# Patient Record
Sex: Female | Born: 1955 | Race: Black or African American | Hispanic: No | Smoking: Former smoker
Health system: Southern US, Community
[De-identification: ages and names within clinical notes are randomized; demographics above are authoritative.]

## PROBLEM LIST (undated history)

## (undated) DIAGNOSIS — I10 Essential (primary) hypertension: Secondary | ICD-10-CM

## (undated) HISTORY — PX: COLONOSCOPY: SHX174

---

## 2006-02-01 ENCOUNTER — Emergency Department: Payer: Self-pay | Admitting: Emergency Medicine

## 2006-08-28 ENCOUNTER — Ambulatory Visit: Payer: Self-pay | Admitting: Internal Medicine

## 2006-10-08 ENCOUNTER — Ambulatory Visit: Payer: Self-pay

## 2007-10-10 ENCOUNTER — Ambulatory Visit: Payer: Self-pay

## 2009-03-22 ENCOUNTER — Ambulatory Visit: Payer: Self-pay

## 2009-10-19 ENCOUNTER — Emergency Department: Payer: Self-pay | Admitting: Emergency Medicine

## 2009-11-10 ENCOUNTER — Emergency Department: Payer: Self-pay | Admitting: Emergency Medicine

## 2010-06-02 ENCOUNTER — Ambulatory Visit: Payer: Self-pay | Admitting: Family Medicine

## 2011-10-11 ENCOUNTER — Ambulatory Visit: Payer: Self-pay | Admitting: Family Medicine

## 2013-01-30 ENCOUNTER — Ambulatory Visit: Payer: Self-pay | Admitting: Family Medicine

## 2014-02-17 ENCOUNTER — Ambulatory Visit: Payer: Self-pay | Admitting: Family Medicine

## 2014-08-14 ENCOUNTER — Telehealth: Payer: Self-pay | Admitting: Surgery

## 2014-08-14 NOTE — Telephone Encounter (Signed)
Received referral from [facility] Pt needs office appt to have lipoma check on back. Phoned patient no answer and cant leave voicemail

## 2015-02-22 ENCOUNTER — Other Ambulatory Visit: Payer: Self-pay | Admitting: Family Medicine

## 2015-02-22 DIAGNOSIS — Z1231 Encounter for screening mammogram for malignant neoplasm of breast: Secondary | ICD-10-CM

## 2015-03-02 ENCOUNTER — Ambulatory Visit: Payer: BC Managed Care – PPO

## 2015-03-03 ENCOUNTER — Ambulatory Visit
Admission: RE | Admit: 2015-03-03 | Discharge: 2015-03-03 | Disposition: A | Discharge status: Home / Self Care | Payer: BC Managed Care – PPO | Source: Ambulatory Visit | Attending: Family Medicine | Admitting: Family Medicine

## 2015-03-03 DIAGNOSIS — Z1231 Encounter for screening mammogram for malignant neoplasm of breast: Secondary | ICD-10-CM

## 2016-03-09 ENCOUNTER — Other Ambulatory Visit: Payer: Self-pay | Admitting: Family Medicine

## 2016-03-09 DIAGNOSIS — Z1231 Encounter for screening mammogram for malignant neoplasm of breast: Secondary | ICD-10-CM

## 2016-03-15 ENCOUNTER — Other Ambulatory Visit: Payer: Self-pay | Admitting: Family Medicine

## 2016-03-15 ENCOUNTER — Ambulatory Visit
Admission: RE | Admit: 2016-03-15 | Discharge: 2016-03-15 | Disposition: A | Discharge status: Home / Self Care | Payer: BC Managed Care – PPO | Source: Ambulatory Visit | Attending: Family Medicine | Admitting: Family Medicine

## 2016-03-15 DIAGNOSIS — Z1231 Encounter for screening mammogram for malignant neoplasm of breast: Secondary | ICD-10-CM | POA: Diagnosis not present

## 2016-07-29 ENCOUNTER — Encounter: Payer: Self-pay | Admitting: Emergency Medicine

## 2016-07-29 ENCOUNTER — Emergency Department
Admission: EM | Admit: 2016-07-29 | Discharge: 2016-07-29 | Disposition: A | Discharge status: Home / Self Care | Payer: BC Managed Care – PPO | Attending: Emergency Medicine | Admitting: Emergency Medicine

## 2016-07-29 DIAGNOSIS — Y92410 Unspecified street and highway as the place of occurrence of the external cause: Secondary | ICD-10-CM | POA: Diagnosis not present

## 2016-07-29 DIAGNOSIS — I10 Essential (primary) hypertension: Secondary | ICD-10-CM | POA: Insufficient documentation

## 2016-07-29 DIAGNOSIS — S29012A Strain of muscle and tendon of back wall of thorax, initial encounter: Secondary | ICD-10-CM | POA: Diagnosis not present

## 2016-07-29 DIAGNOSIS — S3992XA Unspecified injury of lower back, initial encounter: Secondary | ICD-10-CM | POA: Diagnosis present

## 2016-07-29 DIAGNOSIS — Z87891 Personal history of nicotine dependence: Secondary | ICD-10-CM | POA: Insufficient documentation

## 2016-07-29 DIAGNOSIS — Y939 Activity, unspecified: Secondary | ICD-10-CM | POA: Insufficient documentation

## 2016-07-29 DIAGNOSIS — S39012A Strain of muscle, fascia and tendon of lower back, initial encounter: Secondary | ICD-10-CM

## 2016-07-29 DIAGNOSIS — Y999 Unspecified external cause status: Secondary | ICD-10-CM | POA: Diagnosis not present

## 2016-07-29 HISTORY — DX: Essential (primary) hypertension: I10

## 2016-07-29 MED ORDER — MELOXICAM 15 MG PO TABS: 15.0000 mg | ORAL_TABLET | Freq: Every day | ORAL | 0 refills | Status: DC

## 2016-07-29 MED ORDER — METHOCARBAMOL 500 MG PO TABS: 500.0000 mg | ORAL_TABLET | Freq: Four times a day (QID) | ORAL | 0 refills | Status: DC

## 2016-07-29 MED ORDER — ORPHENADRINE CITRATE 30 MG/ML IJ SOLN
60.0000 mg | Freq: Once | INTRAMUSCULAR | Status: AC
  Administered 2016-07-29: 60 mg via INTRAMUSCULAR
  Filled 2016-07-29: qty 2

## 2016-07-29 MED ORDER — KETOROLAC TROMETHAMINE 60 MG/2ML IM SOLN
60.0000 mg | Freq: Once | INTRAMUSCULAR | Status: AC
  Administered 2016-07-29: 60 mg via INTRAMUSCULAR
  Filled 2016-07-29: qty 2
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
## 2016-07-29 NOTE — ED Notes (Signed)
Pt verbalizes understanding of discharge instructions.
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
## 2016-08-07 ENCOUNTER — Ambulatory Visit
Admission: RE | Admit: 2016-08-07 | Discharge: 2016-08-07 | Disposition: A | Discharge status: Home / Self Care | Payer: BC Managed Care – PPO | Source: Ambulatory Visit | Attending: Chiropractor | Admitting: Chiropractor

## 2016-08-07 ENCOUNTER — Other Ambulatory Visit: Payer: Self-pay | Admitting: Chiropractor

## 2016-08-07 DIAGNOSIS — M546 Pain in thoracic spine: Secondary | ICD-10-CM

## 2016-08-07 DIAGNOSIS — M545 Low back pain: Secondary | ICD-10-CM

## 2016-08-07 DIAGNOSIS — M542 Cervicalgia: Secondary | ICD-10-CM

## 2016-08-07 DIAGNOSIS — M50323 Other cervical disc degeneration at C6-C7 level: Secondary | ICD-10-CM | POA: Diagnosis not present

## 2016-08-07 DIAGNOSIS — M549 Dorsalgia, unspecified: Secondary | ICD-10-CM | POA: Diagnosis present

## 2017-04-12 ENCOUNTER — Other Ambulatory Visit: Payer: Self-pay | Admitting: Family Medicine

## 2017-04-12 DIAGNOSIS — Z1231 Encounter for screening mammogram for malignant neoplasm of breast: Secondary | ICD-10-CM

## 2017-04-18 ENCOUNTER — Encounter (INDEPENDENT_AMBULATORY_CARE_PROVIDER_SITE_OTHER): Payer: Self-pay

## 2017-04-18 ENCOUNTER — Ambulatory Visit
Admission: RE | Admit: 2017-04-18 | Discharge: 2017-04-18 | Disposition: A | Discharge status: Home / Self Care | Payer: BC Managed Care – PPO | Source: Ambulatory Visit | Attending: Family Medicine | Admitting: Family Medicine

## 2017-04-18 DIAGNOSIS — Z1231 Encounter for screening mammogram for malignant neoplasm of breast: Secondary | ICD-10-CM | POA: Diagnosis present

## 2018-01-25 ENCOUNTER — Other Ambulatory Visit: Payer: Self-pay | Admitting: Family Medicine

## 2018-01-25 ENCOUNTER — Ambulatory Visit
Admission: RE | Admit: 2018-01-25 | Discharge: 2018-01-25 | Disposition: A | Discharge status: Home / Self Care | Payer: BC Managed Care – PPO | Attending: Family Medicine | Admitting: Family Medicine

## 2018-01-25 ENCOUNTER — Ambulatory Visit
Admission: RE | Admit: 2018-01-25 | Discharge: 2018-01-25 | Disposition: A | Discharge status: Home / Self Care | Payer: BC Managed Care – PPO | Source: Ambulatory Visit | Attending: Family Medicine | Admitting: Family Medicine

## 2018-01-25 DIAGNOSIS — G8929 Other chronic pain: Secondary | ICD-10-CM | POA: Insufficient documentation

## 2018-01-25 DIAGNOSIS — M79671 Pain in right foot: Secondary | ICD-10-CM | POA: Diagnosis present

## 2018-01-25 DIAGNOSIS — M25562 Pain in left knee: Secondary | ICD-10-CM

## 2018-07-18 ENCOUNTER — Other Ambulatory Visit: Payer: Self-pay | Admitting: Family Medicine

## 2018-07-18 DIAGNOSIS — Z1231 Encounter for screening mammogram for malignant neoplasm of breast: Secondary | ICD-10-CM

## 2018-07-30 ENCOUNTER — Other Ambulatory Visit: Payer: Self-pay

## 2018-07-30 ENCOUNTER — Encounter (INDEPENDENT_AMBULATORY_CARE_PROVIDER_SITE_OTHER): Payer: Self-pay

## 2018-07-30 ENCOUNTER — Ambulatory Visit
Admission: RE | Admit: 2018-07-30 | Discharge: 2018-07-30 | Disposition: A | Discharge status: Home / Self Care | Payer: BC Managed Care – PPO | Source: Ambulatory Visit | Attending: Family Medicine | Admitting: Family Medicine

## 2018-07-30 DIAGNOSIS — Z1231 Encounter for screening mammogram for malignant neoplasm of breast: Secondary | ICD-10-CM | POA: Insufficient documentation

## 2019-07-24 ENCOUNTER — Other Ambulatory Visit: Payer: Self-pay | Admitting: Family Medicine

## 2019-07-24 DIAGNOSIS — Z1231 Encounter for screening mammogram for malignant neoplasm of breast: Secondary | ICD-10-CM

## 2019-08-05 ENCOUNTER — Ambulatory Visit: Payer: BC Managed Care – PPO

## 2019-08-19 ENCOUNTER — Ambulatory Visit
Admission: RE | Admit: 2019-08-19 | Discharge: 2019-08-19 | Disposition: A | Discharge status: Home / Self Care | Payer: BC Managed Care – PPO | Source: Ambulatory Visit | Attending: Family Medicine | Admitting: Family Medicine

## 2019-08-19 ENCOUNTER — Other Ambulatory Visit: Payer: Self-pay

## 2019-08-19 DIAGNOSIS — Z1231 Encounter for screening mammogram for malignant neoplasm of breast: Secondary | ICD-10-CM | POA: Diagnosis present

## 2020-08-25 ENCOUNTER — Other Ambulatory Visit: Payer: Self-pay | Admitting: Family Medicine

## 2020-08-25 DIAGNOSIS — Z1231 Encounter for screening mammogram for malignant neoplasm of breast: Secondary | ICD-10-CM

## 2020-09-14 ENCOUNTER — Ambulatory Visit
Admission: RE | Admit: 2020-09-14 | Discharge: 2020-09-14 | Disposition: A | Discharge status: Home / Self Care | Payer: BC Managed Care – PPO | Source: Ambulatory Visit | Attending: Family Medicine | Admitting: Family Medicine

## 2020-09-14 ENCOUNTER — Other Ambulatory Visit: Payer: Self-pay

## 2020-09-14 DIAGNOSIS — Z1231 Encounter for screening mammogram for malignant neoplasm of breast: Secondary | ICD-10-CM | POA: Diagnosis not present

## 2021-04-08 ENCOUNTER — Other Ambulatory Visit: Payer: Self-pay | Admitting: Family Medicine

## 2021-04-08 DIAGNOSIS — I10 Essential (primary) hypertension: Secondary | ICD-10-CM

## 2021-04-08 DIAGNOSIS — N261 Atrophy of kidney (terminal): Secondary | ICD-10-CM

## 2021-04-19 ENCOUNTER — Ambulatory Visit
Admission: RE | Admit: 2021-04-19 | Discharge: 2021-04-19 | Disposition: A | Discharge status: Home / Self Care | Payer: BC Managed Care – PPO | Source: Ambulatory Visit | Attending: Family Medicine | Admitting: Family Medicine

## 2021-04-19 ENCOUNTER — Other Ambulatory Visit: Payer: Self-pay

## 2021-04-19 DIAGNOSIS — N261 Atrophy of kidney (terminal): Secondary | ICD-10-CM | POA: Insufficient documentation

## 2021-04-19 DIAGNOSIS — I10 Essential (primary) hypertension: Secondary | ICD-10-CM | POA: Diagnosis not present

## 2021-08-30 ENCOUNTER — Other Ambulatory Visit: Payer: Self-pay | Admitting: Family Medicine

## 2021-08-30 DIAGNOSIS — Z1231 Encounter for screening mammogram for malignant neoplasm of breast: Secondary | ICD-10-CM

## 2021-09-15 ENCOUNTER — Ambulatory Visit
Admission: RE | Admit: 2021-09-15 | Discharge: 2021-09-15 | Disposition: A | Discharge status: Home / Self Care | Payer: BC Managed Care – PPO | Source: Ambulatory Visit | Attending: Family Medicine | Admitting: Family Medicine

## 2021-09-15 DIAGNOSIS — Z1231 Encounter for screening mammogram for malignant neoplasm of breast: Secondary | ICD-10-CM | POA: Diagnosis present

## 2021-09-20 ENCOUNTER — Other Ambulatory Visit: Payer: Self-pay | Admitting: Family Medicine

## 2021-09-20 DIAGNOSIS — R921 Mammographic calcification found on diagnostic imaging of breast: Secondary | ICD-10-CM

## 2021-09-20 DIAGNOSIS — R928 Other abnormal and inconclusive findings on diagnostic imaging of breast: Secondary | ICD-10-CM

## 2021-10-05 ENCOUNTER — Ambulatory Visit
Admission: RE | Admit: 2021-10-05 | Discharge: 2021-10-05 | Disposition: A | Discharge status: Home / Self Care | Payer: BC Managed Care – PPO | Source: Ambulatory Visit | Attending: Family Medicine | Admitting: Family Medicine

## 2021-10-05 DIAGNOSIS — R928 Other abnormal and inconclusive findings on diagnostic imaging of breast: Secondary | ICD-10-CM | POA: Diagnosis present

## 2021-10-05 DIAGNOSIS — R921 Mammographic calcification found on diagnostic imaging of breast: Secondary | ICD-10-CM | POA: Insufficient documentation

## 2021-10-07 ENCOUNTER — Other Ambulatory Visit: Payer: Self-pay | Admitting: Family Medicine

## 2021-10-07 DIAGNOSIS — R921 Mammographic calcification found on diagnostic imaging of breast: Secondary | ICD-10-CM

## 2021-10-07 DIAGNOSIS — R928 Other abnormal and inconclusive findings on diagnostic imaging of breast: Secondary | ICD-10-CM

## 2021-10-12 IMAGING — MG MM DIGITAL SCREENING BILAT W/ TOMO AND CAD
8 series · 8 of 24 positions shown · non-contrast
Comparison: Previous exam(s).

CLINICAL DATA: Screening.

EXAM:
DIGITAL SCREENING BILATERAL MAMMOGRAM WITH TOMOSYNTHESIS AND CAD
TECHNIQUE: Bilateral screening digital craniocaudal and mediolateral oblique
mammograms were obtained. Bilateral screening digital breast
tomosynthesis was performed. The images were evaluated with
computer-aided detection.

[R MLO synth-2D]
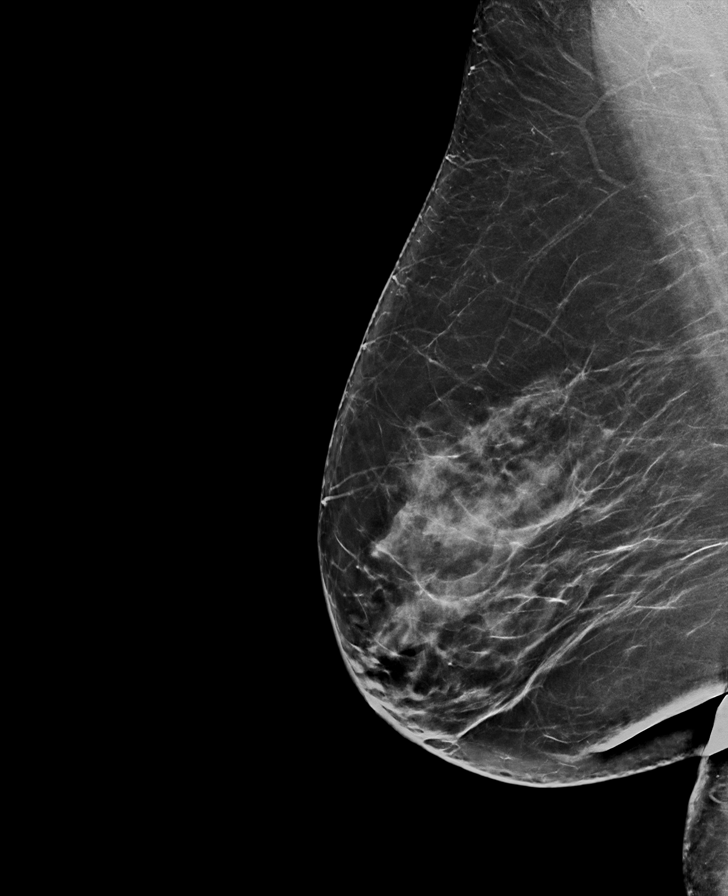

[R CC synth-2D]
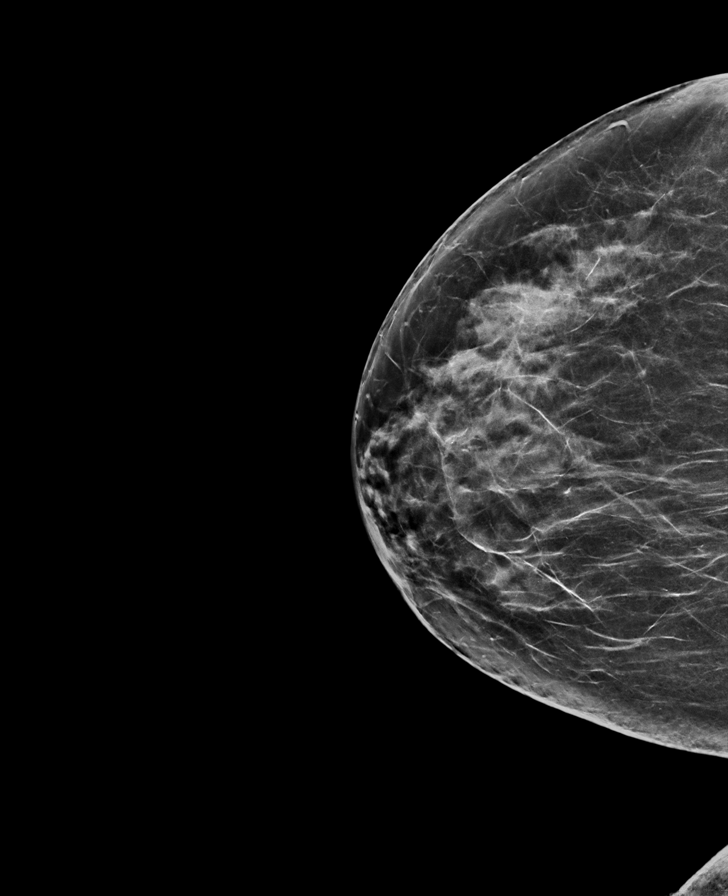

[L CC synth-2D]
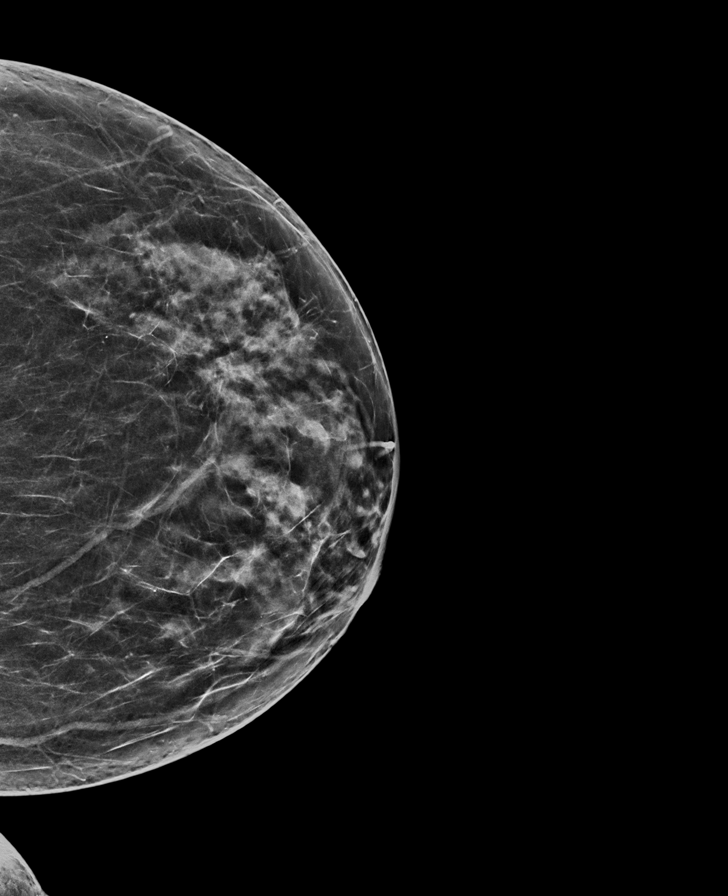

[L MLO synth-2D]
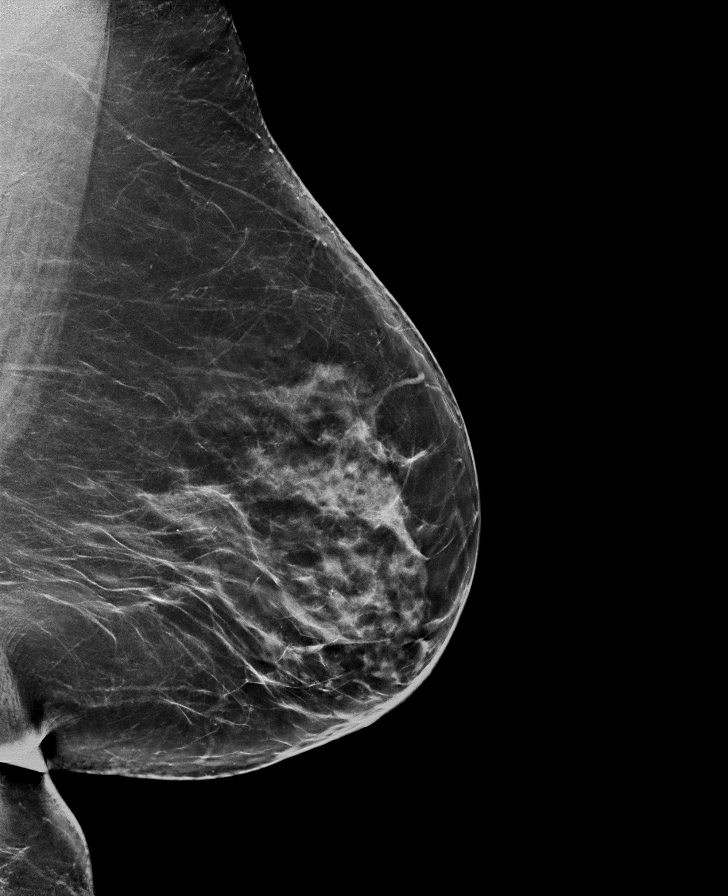

[L MLO tomo · tomo slice 43/86.0]
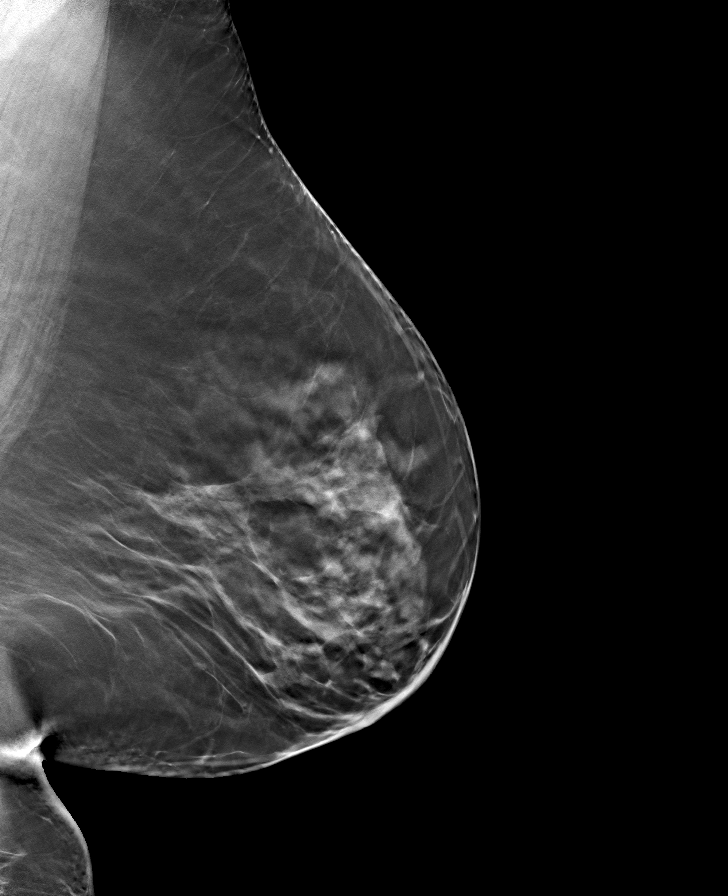

[R MLO tomo · tomo slice 43/85.0]
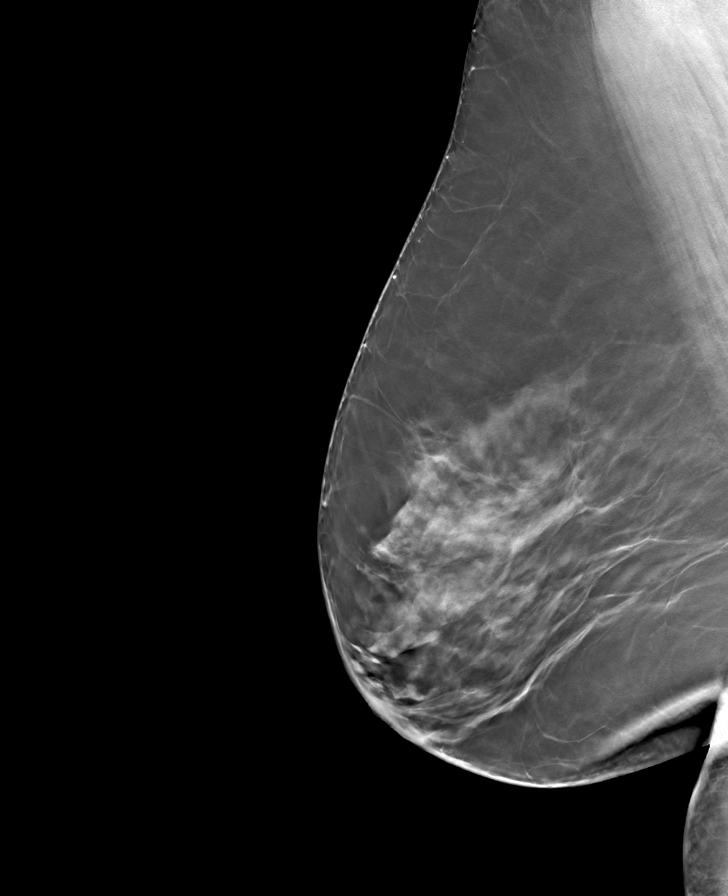

[L CC tomo · tomo slice 35/70.0]
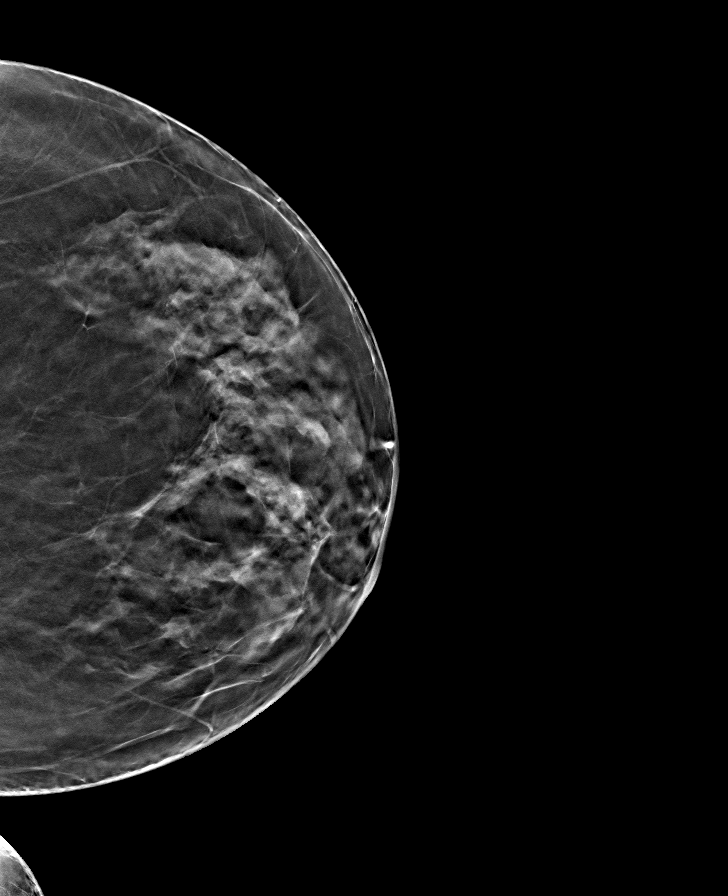

[R CC tomo · tomo slice 38/75.0]
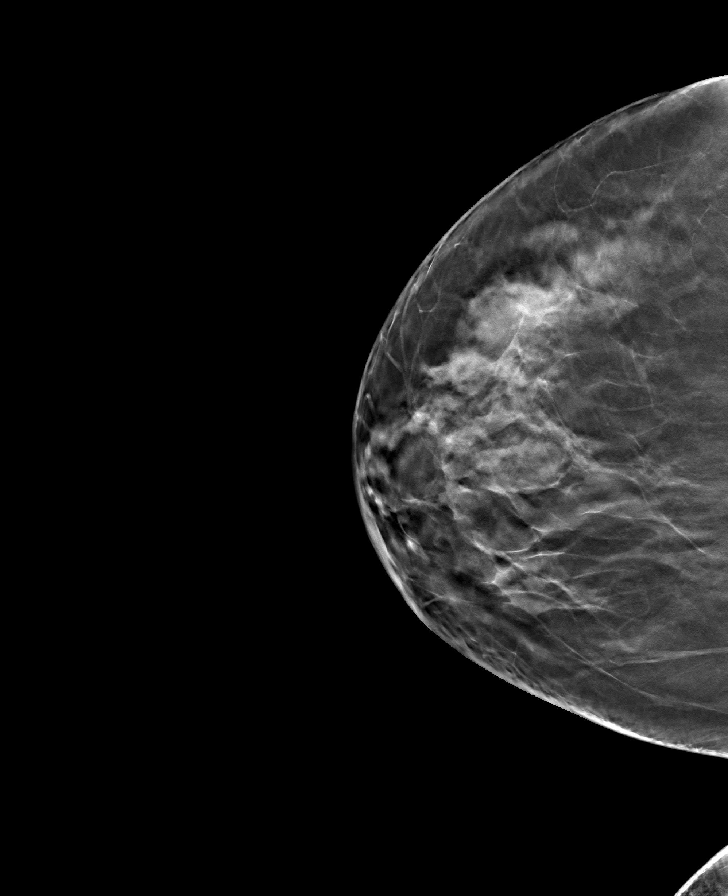

[8 of 24 positions shown; findings below may reference images not displayed]

ACR Breast Density Category c: The breast tissue is heterogeneously
dense, which may obscure small masses.
FINDINGS: There are no findings suspicious for malignancy.
IMPRESSION: No mammographic evidence of malignancy. A result letter of this
screening mammogram will be mailed directly to the patient.

RECOMMENDATION:
Screening mammogram in one year. (Code:[id number])

BI-RADS CATEGORY  1: Negative.

## 2021-10-19 ENCOUNTER — Ambulatory Visit
Admission: RE | Admit: 2021-10-19 | Discharge: 2021-10-19 | Disposition: A | Discharge status: Home / Self Care | Payer: BC Managed Care – PPO | Source: Ambulatory Visit | Attending: Family Medicine | Admitting: Family Medicine

## 2021-10-19 DIAGNOSIS — R928 Other abnormal and inconclusive findings on diagnostic imaging of breast: Secondary | ICD-10-CM

## 2021-10-19 DIAGNOSIS — R921 Mammographic calcification found on diagnostic imaging of breast: Secondary | ICD-10-CM

## 2021-10-19 HISTORY — PX: BREAST BIOPSY: SHX20

## 2021-10-21 ENCOUNTER — Encounter: Payer: Self-pay | Admitting: *Deleted

## 2021-10-21 DIAGNOSIS — D0512 Intraductal carcinoma in situ of left breast: Secondary | ICD-10-CM

## 2021-10-21 NOTE — Progress Notes (Signed)
Received referral for newly diagnosed breast cancer from [facility].  Navigation initiated.  She will see Dr. Grayland Ormond on 10/3 at 3:00.   She would like to see Dr. Peyton Najjar at [facility], I will call Monday when they open to get that referral.

## 2021-10-24 ENCOUNTER — Encounter: Payer: Self-pay | Admitting: *Deleted

## 2021-10-24 NOTE — Progress Notes (Signed)
Chloe Jones will see Dr. Peyton Najjar on 10/3 at 2:15 and Dr. Grayland Ormond on  10/5 at 3:00.   Appt. Details give to patient.

## 2021-10-25 ENCOUNTER — Other Ambulatory Visit: Payer: BC Managed Care – PPO

## 2021-10-25 ENCOUNTER — Encounter: Payer: Self-pay | Admitting: Oncology

## 2021-10-25 ENCOUNTER — Ambulatory Visit: Payer: BC Managed Care – PPO | Admitting: Oncology

## 2021-10-25 DIAGNOSIS — D0512 Intraductal carcinoma in situ of left breast: Secondary | ICD-10-CM

## 2021-10-25 HISTORY — DX: Intraductal carcinoma in situ of left breast: D05.12

## 2021-10-25 NOTE — Progress Notes (Signed)
[place]  [phone]) [phone] Fax:([phone]) [phone]  ID: Lenard Galloway OB: 17-Sep-1955  MR#: [id number]  HUD#:[id number]  Patient Care Team: [facility] [place] as PCP - Freda Munro, Nelwyn Salisbury, RN as Oncology Nurse Navigator  CHIEF COMPLAINT: DCIS left breast  INTERVAL HISTORY: Patient is a 66 year old female who was noted to have an abnormality on her routine yearly screening mammogram.  Subsequent ultrasound and biopsy revealed noninvasive DCIS.  She currently feels well and is asymptomatic.  She has no neurologic complaints.  She denies any recent fevers or illnesses.  She has a good appetite and denies weight loss.  She has no chest pain, shortness of breath, cough, or chills.  She denies any nausea, vomiting, constipation, or diarrhea.  She has no urinary complaints.  Patient feels at her baseline offers no specific complaints today.  REVIEW OF SYSTEMS:   Review of Systems  Constitutional: Negative.  Negative for fever, malaise/fatigue and weight loss.  Respiratory: Negative.  Negative for cough, hemoptysis and shortness of breath.   Cardiovascular: Negative.  Negative for chest pain and leg swelling.  Gastrointestinal: Negative.  Negative for abdominal pain.  Genitourinary: Negative.  Negative for dysuria.  Musculoskeletal: Negative.  Negative for back pain.  Skin: Negative.  Negative for rash.  Neurological: Negative.  Negative for dizziness, focal weakness, weakness and headaches.  Psychiatric/Behavioral: Negative.  The patient is not nervous/anxious.     As per HPI. Otherwise, a complete review of systems is negative.  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   Hypertension     PAST SURGICAL HISTORY: Past Surgical History:  Procedure Laterality Date   BREAST BIOPSY Left 10/19/2021   Left Breast stereo bx, X clip path pending    FAMILY HISTORY: Family  History  Problem Relation Age of Onset   Breast cancer Neg Hx     ADVANCED DIRECTIVES (Y/N):  N  HEALTH MAINTENANCE: Social History   Tobacco Use   Smoking status: Former   Smokeless tobacco: Never  Substance Use Topics   Alcohol use: Yes   Drug use: Not Currently     Colonoscopy:  PAP:  Bone density:  Lipid panel:  No Known Allergies  Current Outpatient Medications  Medication Sig Dispense Refill   meloxicam (MOBIC) 15 MG tablet Take 1 tablet (15 mg total) by mouth daily. 30 tablet 0   methocarbamol (ROBAXIN) 500 MG tablet Take 1 tablet (500 mg total) by mouth 4 (four) times daily. 16 tablet 0   No current facility-administered medications for this visit.    OBJECTIVE: Vitals:   10/27/21 1453  BP: 112/73  Pulse: 82  Temp: 97.8 F (36.6 C)     Body mass index is 40.68 kg/m.    ECOG FS:0 - Asymptomatic  General: Well-developed, well-nourished, no acute distress. Eyes: Pink conjunctiva, anicteric sclera. HEENT: Normocephalic, moist mucous membranes. Breast: Exam recently performed by another provider. Lungs: No audible wheezing or coughing. Heart: Regular rate and rhythm. Abdomen: Soft, nontender, no obvious distention. Musculoskeletal: No edema, cyanosis, or clubbing. Neuro: Alert, answering all questions appropriately. Cranial nerves grossly intact. Skin: No rashes or petechiae noted. Psych: Normal affect. Lymphatics: No cervical, calvicular, axillary or inguinal LAD.   LAB RESULTS:  No results found for: "NA", "K", "CL", "CO2", "GLUCOSE", "BUN", "CREATININE", "CALCIUM", "PROT", "ALBUMIN", "AST", "ALT", "ALKPHOS", "BILITOT", "GFRNONAA", "GFRAA"  No results found for: "WBC", "NEUTROABS", "HGB", "HCT", "MCV", "PLT"   STUDIES:  MM LT BREAST BX W LOC DEV 1ST LESION IMAGE BX SPEC STEREO GUIDE  Addendum Date: 10/21/2021   ADDENDUM REPORT: 10/21/2021 09:08 ADDENDUM: Pathology revealed DUCTAL CARCINOMA IN SITU (DCIS), HIGH-GRADE, COMEDO TYPE WITH ASSOCIATED  CALCIFICATIONS of the LEFT breast, inner aspect (x clip). This was found to be concordant by Dr. Valentino Saxon. Pathology results were discussed with the patient by telephone. The patient reported doing well after the biopsy with tenderness at the site. Post biopsy instructions were reviewed and questions were answered. The patient was encouraged to call [facility] for any additional concerns. Reports and recommendations sent to Roberts, Oncology Nurse Navigator at [facility] for surgical / oncology referral. Consider Breast MRI with and without contrast given high grade histology. Pathology results reported by Stacie Acres RN on 10/20/2021. Electronically Signed   By: Valentino Saxon M.D.   On: 10/21/2021 09:08   Result Date: 10/21/2021 CLINICAL DATA:  Indeterminate calcifications EXAM: LEFT BREAST STEREOTACTIC CORE NEEDLE BIOPSY COMPARISON:  Previous exam(s). FINDINGS: The patient and I discussed the procedure of stereotactic-guided biopsy including benefits and alternatives. We discussed the high likelihood of a successful procedure. We discussed the risks of the procedure including infection, bleeding, tissue injury, clip migration, and inadequate sampling. Informed written consent was given. The usual time out protocol was performed immediately prior to the procedure. Using sterile technique and 1% lidocaine and 1% lidocaine with epinephrine as local anesthetic, under stereotactic guidance, a 9 gauge vacuum assisted device was used to perform core needle biopsy of calcifications in the inner aspect of the left breast using a superior approach. Specimen radiograph was performed showing representative calcifications in 5 of 6 samples. Specimens with calcifications are identified for pathology. Lesion quadrant: Upper inner quadrant At the conclusion of the procedure, an X shaped tissue marker clip was deployed into the biopsy cavity. Follow-up  2-view mammogram was performed and dictated separately. IMPRESSION: Stereotactic-guided biopsy of indeterminate calcifications. No apparent complications. Electronically Signed: By: Valentino Saxon M.D. On: 10/19/2021 10:09   MM CLIP PLACEMENT LEFT  Result Date: 10/19/2021 CLINICAL DATA:  Indeterminate calcifications. Status post stereotactic guided biopsy EXAM: 3D DIAGNOSTIC LEFT MAMMOGRAM POST STEREOTACTIC BIOPSY COMPARISON:  Previous exam(s). FINDINGS: 3D Mammographic images were obtained following stereotactic guided biopsy of calcifications. The X shaped biopsy marking clip is in expected position at the site of biopsy. IMPRESSION: Appropriate positioning of the X shaped biopsy marking clip at the site of biopsy in the inner breast. Final Assessment: Post Procedure Mammograms for Marker Placement Electronically Signed   By: Valentino Saxon M.D.   On: 10/19/2021 10:10  MM DIAG BREAST TOMO UNI LEFT  Result Date: 10/05/2021 CLINICAL DATA:  Callback for LEFT breast calcifications. EXAM: DIGITAL DIAGNOSTIC UNILATERAL LEFT MAMMOGRAM WITH TOMOSYNTHESIS TECHNIQUE: Left digital diagnostic mammography and breast tomosynthesis was performed. COMPARISON:  Previous exam(s). ACR Breast Density Category c: The breast tissue is heterogeneously dense, which may obscure small masses. FINDINGS: Spot magnification views of the LEFT breast demonstrate a 3 mm group of pleomorphic calcifications in the LEFT inner breast at middle depth. These are not definitively stable in comparison to remote prior mammograms. No additional suspicious findings are noted. IMPRESSION: There is a 3 mm group of indeterminate calcifications in the LEFT inner breast at middle depth. Recommend stereotactic guided biopsy for definitive characterization. RECOMMENDATION: LEFT breast stereotactic guided biopsy x1 I have discussed the findings and recommendations with the patient. The biopsy procedure was discussed with the  patient and questions  were answered. Patient expressed their understanding of the biopsy recommendation. Patient will be scheduled for biopsy at her earliest convenience by the schedulers. Ordering provider will be notified. If applicable, a reminder letter will be sent to the patient regarding the next appointment. BI-RADS CATEGORY  4: Suspicious. Electronically Signed   By: Valentino Saxon M.D.   On: 10/05/2021 15:49   ASSESSMENT: DCIS left breast.  PLAN:    DCIS left breast: Pathology and imaging reviewed independently.  Patient has had consultation with surgery plans of lumpectomy scheduled for November 02, 2021.  Given the noninvasive nature of the lesion, she does not require adjuvant chemotherapy.  Given she is having a lumpectomy, she will benefit from adjuvant XRT and referral has been placed to radiation oncology.  She will also likely benefit from tamoxifen for 5 years at the completion of her treatments.  Return to clinic on November 16, 2021 2 weeks after her surgery to discuss her final pathology results, additional treatment planning if necessary, and consultation with radiation oncology.  I spent a total of 60 minutes reviewing chart data, face-to-face evaluation with the patient, counseling and coordination of care as detailed above.   Patient expressed understanding and was in agreement with this plan. She also understands that She can call clinic at any time with any questions, concerns, or complaints.    Cancer Staging  Ductal carcinoma in situ (DCIS) of left breast Staging form: Breast, AJCC 8th Edition - Clinical stage from 10/28/2021: Stage 0 (cTis (DCIS), cN0, cM0, ER+, PR: Not Assessed, HER2: Not Assessed) - Signed by Lloyd Huger, MD on 10/28/2021 Stage prefix: Initial diagnosis   Lloyd Huger, MD   10/28/2021 6:39 AM

## 2021-10-26 ENCOUNTER — Other Ambulatory Visit: Payer: Self-pay | Admitting: General Surgery

## 2021-10-26 DIAGNOSIS — D0512 Intraductal carcinoma in situ of left breast: Secondary | ICD-10-CM

## 2021-10-27 ENCOUNTER — Inpatient Hospital Stay: Payer: BC Managed Care – PPO | Attending: Oncology | Admitting: Oncology

## 2021-10-27 ENCOUNTER — Ambulatory Visit: Payer: Self-pay | Admitting: General Surgery

## 2021-10-27 ENCOUNTER — Encounter: Payer: Self-pay | Admitting: Oncology

## 2021-10-27 ENCOUNTER — Encounter: Payer: Self-pay | Admitting: *Deleted

## 2021-10-27 ENCOUNTER — Inpatient Hospital Stay: Payer: BC Managed Care – PPO

## 2021-10-27 DIAGNOSIS — I10 Essential (primary) hypertension: Secondary | ICD-10-CM | POA: Insufficient documentation

## 2021-10-27 DIAGNOSIS — D0512 Intraductal carcinoma in situ of left breast: Secondary | ICD-10-CM

## 2021-10-27 DIAGNOSIS — Z87891 Personal history of nicotine dependence: Secondary | ICD-10-CM | POA: Insufficient documentation

## 2021-10-27 NOTE — H&P (View-Only) (Signed)
PATIENT PROFILE: Chloe Jones is a 66 y.o. female who presents to the Clinic for consultation at the request of Dr. 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  PCP:  Chloe Median, MD  HISTORY OF PRESENT ILLNESS: [name] reports she had her usual screening mammogram on 09/16/2018.  She was found with calcification of the left breast.  There was a 3 mm calcifications.  She had diagnostic mammogram that confirmed that the suspicious calcifications are concerning for malignancy.  This led to stereotactic core needle biopsy.  He was found with DCIS high-grade with comedonecrosis.  I personally evaluated the images of the screening, diagnostic mammograms and the core needle biopsy.  Patient denies any breast pain, any nipple discharge, any palpable masses or any skin changes.  PROBLEM LIST: Problem List  Date Reviewed: 09/12/2021          Noted   Atrophy of right kidney 01/21/2020   Prediabetes 07/18/2018   Neoplasm of uncertain behavior 6/76/7209   LVH (left ventricular hypertrophy) 02/18/2013   Hypertriglyceridemia 01/24/2013   Microalbuminuria 01/24/2013   HTN (hypertension) 01/20/2013   Insomnia 11/03/2011   Body mass index (BMI) of 40.0-44.9 in adult (CMS-HCC) 11/03/2011   Menopausal state 11/03/2011   Carpal tunnel syndrome, bilateral Unknown   Hx of adenomatous polyp of colon 08/26/2009   Overview    Colonoscopy 2011 Dr Chloe Jones 60m adenoma        GENERAL REVIEW OF SYSTEMS:   General ROS: negative for - chills, fatigue, fever, weight gain or weight loss Allergy and Immunology ROS: negative for - hives  Hematological and Lymphatic ROS: negative for - bleeding problems or bruising, negative for palpable nodes Endocrine ROS: negative for - heat or cold intolerance, hair changes Respiratory ROS: negative for - cough, shortness of breath or wheezing Cardiovascular ROS: no chest pain or palpitations GI ROS: negative for nausea, vomiting, abdominal pain, diarrhea,  constipation Musculoskeletal ROS: negative for - joint swelling or muscle pain Neurological ROS: negative for - confusion, syncope Dermatological ROS: negative for pruritus and rash Psychiatric: negative for anxiety, depression, difficulty sleeping and memory loss  MEDICATIONS: Current Outpatient Medications  Medication Sig Dispense Refill   amLODIPine (NORVASC) 10 MG tablet TAKE 1 TABLET BY MOUTH EVERY DAY 90 tablet 3   losartan (COZAAR) 25 MG tablet TAKE 1 TABLET BY MOUTH EVERY DAY 90 tablet 3   magnesium 200 mg Take 200 mg by mouth once daily. (Patient not taking: Reported on 09/12/2021)     No current facility-administered medications for this visit.    ALLERGIES: Patient has no known allergies.  PAST MEDICAL HISTORY: Past Medical History:  Diagnosis Date   Atrophy of right kidney 01/21/2020   Body mass index (BMI) of 40.0-44.9 in adult (CMS-HCC) 11/03/2011   Carpal tunnel syndrome, bilateral    HTN (hypertension) 01/20/2013   Hypertriglyceridemia 01/24/2013   Insomnia 11/03/2011   LVH (left ventricular hypertrophy) 02/18/2013   Menopausal state 11/03/2011   Microalbuminuria 01/24/2013   Neoplasm of uncertain behavior 34/70/9628   PAST SURGICAL HISTORY: Past Surgical History:  Procedure Laterality Date   COLONOSCOPY  2011   Dr. TTheophilus Kinds65mpolyp, tubular adenoma   COLONOSCOPY W/REMOVAL LESIONS BY SNARE N/A 08/28/2019   Procedure: COLONOSCOPY, FLEXIBLE; WITH REMOVAL OF TUMOR(S), POLYP(S), OR OTHER LESION(S) BY SNARE TECHNIQUE;  Surgeon: Chloe Jones;  Location: [facility] Service: Gastroenterology;  Laterality: N/A;   COLONOSCOPY N/A 07/20/2021   Procedure: Colonoscopy: h/o 1552molyp;  Surgeon: Chloe Jones  Chloe Romp, MD;  Location: [facility];  Service: Gastroenterology;  Laterality: N/A;     FAMILY HISTORY: Family History  Problem Relation Age of Onset   High blood pressure (Hypertension) Father    Diabetes Father      SOCIAL HISTORY: Social  History   Socioeconomic History   Marital status: Widowed  Occupational History   Occupation: Fish farm manager  Tobacco Use   Smoking status: Former    Years: 3.00    Types: Cigarettes   Smokeless tobacco: Never  Vaping Use   Vaping Use: Never used  Substance and Sexual Activity   Alcohol use: Yes    Alcohol/week: 0.0 standard drinks    Comment: occasionally   Drug use: No   Sexual activity: Never    Partners: Male    Birth control/protection: Surgical    Comment: Tubal sterilization.  Social History Narrative   Widowed, lives alone. Two grown daughters. Two grandchildren that live in [place]. Some college. Works part time in Sales executive. Walks once to twice a week for 45 minutes.     PHYSICAL EXAM: Vitals:   10/25/21 1416  BP: 119/78  Pulse: 86   Body mass index is 41.13 kg/m. Weight: (!) 108.7 kg (239 lb 10.2 oz) (PER CHART)   GENERAL: Alert, active, oriented x3  HEENT: Pupils equal reactive to light. Extraocular movements are intact. Sclera clear. Palpebral conjunctiva normal red color.Pharynx clear.  NECK: Supple with no palpable mass and no adenopathy.  LUNGS: Sound clear with no rales rhonchi or wheezes.  HEART: Regular rhythm S1 and S2 without murmur.  BREAST: breasts appear normal, no suspicious masses, no skin or nipple changes or axillary nodes.  ABDOMEN: Soft and depressible, nontender with no palpable mass, no hepatomegaly.  EXTREMITIES: Well-developed well-nourished symmetrical with no dependent edema.  NEUROLOGICAL: Awake alert oriented, facial expression symmetrical, moving all extremities.  REVIEW OF DATA: I have reviewed the following data today: Office Visit on 09/12/2021  Component Date Value   Diagnostic Interpretation 09/12/2021 Comment    Specimen adequacy: - Lab* 09/12/2021 Comment    Clinician provided ICD10* 09/12/2021 Comment    PERFORMED BY: - [facility] 09/12/2021 Comment    . - [facility] 09/12/2021 .    Note: - [facility]  09/12/2021 Comment    Test Method MT21 - [facility] 09/12/2021 Comment    HPV APTIMA - [facility] 09/12/2021 Negative    HPV Genotype Reflex - [facility] 09/12/2021 Comment   Office Visit on 08/26/2021  Component Date Value   Cholesterol, Total 08/26/2021 150    LDL Calculated 08/26/2021 64    HDL 08/26/2021 71    Triglyceride 08/26/2021 75    Sodium 08/26/2021 138    Potassium 08/26/2021 3.8    Chloride 08/26/2021 108    Carbon Dioxide (CO2) 08/26/2021 22    Urea Nitrogen (BUN) 08/26/2021 17    Creatinine 08/26/2021 0.9    Glucose 08/26/2021 92    Calcium 08/26/2021 9.6    AST (Aspartate Aminotran* 08/26/2021 17    ALT (Alanine Aminotransf* 08/26/2021 17    Bilirubin, Total 08/26/2021 0.4    Alk Phos (Alkaline Phosp* 08/26/2021 84    Albumin 08/26/2021 4.0    Protein, Total 08/26/2021 7.8    Anion Gap 08/26/2021 8    BUN/CREA Ratio 08/26/2021 19    Glomerular Filtration Ra* 08/26/2021 71      ASSESSMENT: Ms. Delamora is a 66 y.o. female presenting for consultation for DCIS of the left breast.    Patient was oriented  again about the pathology results. Surgical alternatives were discussed with patient including partial vs total mastectomy. Surgical technique and post operative care was discussed with patient. Risk of surgery was discussed with patient including but not limited to: wound infection, seroma, hematoma, brachial plexopathy, mondor's disease (thrombosis of small veins of breast), chronic wound pain, breast lymphedema, altered sensation to the nipple and cosmesis among others.   Ductal carcinoma in situ (DCIS) of left breast [D05.12]  PLAN: 1.  Radiofrequency tag partial mastectomy of the left breast (19301) 2.  CBC, CMP 3.  Avoid taking aspirin 5 days before the procedure 4.  Contact us if you have any concern  Patient verbalized understanding, all questions were answered, and were agreeable with the plan outlined above.     Herbert Pun, MD  Electronically signed by  Herbert Pun, MD

## 2021-10-27 NOTE — Research (Addendum)
Trial:  MTG-015 - Tissue and Bodily Fluids: Translational Medicine: Discovery and Evaluation of Biomarkers/Pharmacogenomics for the Diagnosis and Personalized Management of Patients   Patient Chloe Jones was identified by this nurse as a potential candidate for the above listed study.  This Clinical Research Nurse met with Jabier Mutton Perley, [id number], on 10/27/21 in a manner and location that ensures patient privacy to discuss participation in the above listed research study.  Patient is Unaccompanied.  A copy of the informed consent document and separate HIPAA Authorization was provided to the patient.  Patient reads, speaks, and understands [place].   Patient was provided with the business card of this Nurse and encouraged to contact the research team with any questions.  Approximately 15 minutes were spent with the patient reviewing the informed consent documents.  Patient was provided the option of taking informed consent documents home to review and was encouraged to review at their convenience with their support network, including other care providers. Patient took the consent documents home to review.  Research nurse will contact the patient tomorrow at 3:00 pm per her request after she has a chance to review the consent / hipaa as to her decision to participate or not.  Jeral Fruit, RN 10/27/21 3:56 PM  Trial Name:  MTG-015 - Tissue and Bodily Fluids: Translational Medicine: Discovery and Evaluation of Biomarkers/Pharmacogenomics for the Diagnosis and Personalized Management of Patients    Patient Chloe Jones was identified by this nurse as a potential candidate for the above listed study.  This Clinical Research Nurse met with Kolleen Ochsner Wheelwright, [id number] on 11/01/21 in a manner and location that ensures patient privacy to discuss participation in the above listed research study.  Patient is Unaccompanied.  Patient was previously provided with informed consent documents.   Patient confirmed they have read the informed consent documents. As outlined in the informed consent form, this Nurse and Jabier Mutton Etzler discussed the purpose of the research study, the investigational nature of the study, study procedures and requirements for study participation, potential risks and benefits of study participation, as well as alternatives to participation.  This study is not blinded or double-blinded. The patient understands participation is voluntary and they may withdraw from study participation at any time.  This study does not involve randomization.  This study does not involve an investigational drug or device. This study does not involve a placebo. Patient understands enrollment is pending full eligibility review.  Confidentiality and how the patient's information will be used as part of study participation were discussed.  Patient was informed there is reimbursement provided for their time and effort spent on trial participation.  The patient is encouraged to discuss research study participation with their insurance provider to determine what costs they may incur as part of study participation, including research related injury.   All questions were answered to patient's satisfaction.  The informed consent and separate HIPAA Authorization was reviewed page by page.  The patient's mental and emotional status is appropriate to provide informed consent, and the patient verbalizes an understanding of study participation.  Patient has agreed to participate in the above listed research study and has voluntarily signed the informed consent version 7.0 and separate HIPAA Authorization, version 5.0  on 11/01/21 at 2:20PM.  The patient was provided with a copy of the signed informed consent form and separate HIPAA Authorization for their reference.  No study specific procedures were obtained prior to the signing of the informed consent document.  Approximately 15  minutes were spent with the  patient reviewing the informed consent documents.  After obtaining informed consent patient, voluntarily signed the optional Release of Information form for use throughout trial participation.  The following social, medical, and family history was collected from the patient on 11/01/21.  Date and time of last meal prior to blood collection: 11/01/2021  Does the patient drink alcohol? Occasional  Does the patient use tobacco products? Former.  Date quit: 1998; Number of years smoked: 2; Packs per day 0.25.  Menopausal status? postmenopausal  Date of last menstrual cycle? 2009  Does the patient have a personal history of cancer? No   If yes, what type and dates of diagnosis/treatment: N/A  Does the patient have family history of cancer in immediate family (grandparents, parents, and/or siblings)? No   If yes, what was the relationship, cancer type, date of diagnosis, and outcome for each? N/A   Has the patient received a COVID-19 vaccination? Yes   If yes, vaccine manufacturer? [place]  Number of doses received? 3  Date of last dose? 2022  Has the patient ever tested positive for COVID? Yes   If yes, date of last positive COVID test? 03/2019  Date of last COVID test taken? 03/2019  Has the patient received any other vaccines in the past year? No   If yes, which vaccines and dates? N/A   The patient's medication list was reviewed with the patient and it was not correct but start dates were verified. Patient states she takes Alive MVI 2 daily and Beet gummies x 2 daily.  Has the patient stopped any medications in the past 30 days? No  Patient escorted to the lab by Mauricio Po, CRS to have her study labs drawn and receive her [employer] gift card.   Jeral Fruit, RN 11/01/21 2:40 PM

## 2021-10-27 NOTE — Progress Notes (Signed)
Accompanied patient initial medical oncology appointment.   Reviewed Breast Cancer treatment handbook.   Care plan summary given to patient.   Reviewed outreach programs and cancer center services.

## 2021-10-27 NOTE — H&P (Signed)
PATIENT PROFILE: Chloe Jones is a 66 y.o. female who presents to the Clinic for consultation at the request of Dr. 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  PCP:  Chloe Median, MD  HISTORY OF PRESENT ILLNESS: [name] reports she had her usual screening mammogram on 09/16/2018.  She was found with calcification of the left breast.  There was a 3 mm calcifications.  She had diagnostic mammogram that confirmed that the suspicious calcifications are concerning for malignancy.  This led to stereotactic core needle biopsy.  He was found with DCIS high-grade with comedonecrosis.  I personally evaluated the images of the screening, diagnostic mammograms and the core needle biopsy.  Patient denies any breast pain, any nipple discharge, any palpable masses or any skin changes.  PROBLEM LIST: Problem List  Date Reviewed: 09/12/2021          Noted   Atrophy of right kidney 01/21/2020   Prediabetes 07/18/2018   Neoplasm of uncertain behavior 6/76/7209   LVH (left ventricular hypertrophy) 02/18/2013   Hypertriglyceridemia 01/24/2013   Microalbuminuria 01/24/2013   HTN (hypertension) 01/20/2013   Insomnia 11/03/2011   Body mass index (BMI) of 40.0-44.9 in adult (CMS-HCC) 11/03/2011   Menopausal state 11/03/2011   Carpal tunnel syndrome, bilateral Unknown   Hx of adenomatous polyp of colon 08/26/2009   Overview    Colonoscopy 2011 Dr Chloe Jones 60m adenoma        GENERAL REVIEW OF SYSTEMS:   General ROS: negative for - chills, fatigue, fever, weight gain or weight loss Allergy and Immunology ROS: negative for - hives  Hematological and Lymphatic ROS: negative for - bleeding problems or bruising, negative for palpable nodes Endocrine ROS: negative for - heat or cold intolerance, hair changes Respiratory ROS: negative for - cough, shortness of breath or wheezing Cardiovascular ROS: no chest pain or palpitations GI ROS: negative for nausea, vomiting, abdominal pain, diarrhea,  constipation Musculoskeletal ROS: negative for - joint swelling or muscle pain Neurological ROS: negative for - confusion, syncope Dermatological ROS: negative for pruritus and rash Psychiatric: negative for anxiety, depression, difficulty sleeping and memory loss  MEDICATIONS: Current Outpatient Medications  Medication Sig Dispense Refill   amLODIPine (NORVASC) 10 MG tablet TAKE 1 TABLET BY MOUTH EVERY DAY 90 tablet 3   losartan (COZAAR) 25 MG tablet TAKE 1 TABLET BY MOUTH EVERY DAY 90 tablet 3   magnesium 200 mg Take 200 mg by mouth once daily. (Patient not taking: Reported on 09/12/2021)     No current facility-administered medications for this visit.    ALLERGIES: Patient has no known allergies.  PAST MEDICAL HISTORY: Past Medical History:  Diagnosis Date   Atrophy of right kidney 01/21/2020   Body mass index (BMI) of 40.0-44.9 in adult (CMS-HCC) 11/03/2011   Carpal tunnel syndrome, bilateral    HTN (hypertension) 01/20/2013   Hypertriglyceridemia 01/24/2013   Insomnia 11/03/2011   LVH (left ventricular hypertrophy) 02/18/2013   Menopausal state 11/03/2011   Microalbuminuria 01/24/2013   Neoplasm of uncertain behavior 34/70/9628   PAST SURGICAL HISTORY: Past Surgical History:  Procedure Laterality Date   COLONOSCOPY  2011   Dr. TTheophilus Kinds65mpolyp, tubular adenoma   COLONOSCOPY W/REMOVAL LESIONS BY SNARE N/A 08/28/2019   Procedure: COLONOSCOPY, FLEXIBLE; WITH REMOVAL OF TUMOR(S), POLYP(S), OR OTHER LESION(S) BY SNARE TECHNIQUE;  Surgeon: WiColvin CaroliMD;  Location: [facility] Service: Gastroenterology;  Laterality: N/A;   COLONOSCOPY N/A 07/20/2021   Procedure: Colonoscopy: h/o 1552molyp;  Surgeon: WilRedmond Jones  Chloe Romp, MD;  Location: [facility]O/[facility];  Service: Gastroenterology;  Laterality: N/A;     FAMILY HISTORY: Family History  Problem Relation Age of Onset   High blood pressure (Hypertension) Father    Diabetes Father      SOCIAL HISTORY: Social  History   Socioeconomic History   Marital status: Widowed  Occupational History   Occupation: Fish farm manager  Tobacco Use   Smoking status: Former    Years: 3.00    Types: Cigarettes   Smokeless tobacco: Never  Vaping Use   Vaping Use: Never used  Substance and Sexual Activity   Alcohol use: Yes    Alcohol/week: 0.0 standard drinks    Comment: occasionally   Drug use: No   Sexual activity: Never    Partners: Male    Birth control/protection: Surgical    Comment: Tubal sterilization.  Social History Narrative   Widowed, lives alone. Two grown daughters. Two grandchildren that live in [place]. Some college. Works part time in Sales executive. Walks once to twice a week for 45 minutes.     PHYSICAL EXAM: Vitals:   10/25/21 1416  BP: 119/78  Pulse: 86   Body mass index is 41.13 kg/m. Weight: (!) 108.7 kg (239 lb 10.2 oz) (PER CHART)   GENERAL: Alert, active, oriented x3  HEENT: Pupils equal reactive to light. Extraocular movements are intact. Sclera clear. Palpebral conjunctiva normal red color.Pharynx clear.  NECK: Supple with no palpable mass and no adenopathy.  LUNGS: Sound clear with no rales rhonchi or wheezes.  HEART: Regular rhythm S1 and S2 without murmur.  BREAST: breasts appear normal, no suspicious masses, no skin or nipple changes or axillary nodes.  ABDOMEN: Soft and depressible, nontender with no palpable mass, no hepatomegaly.  EXTREMITIES: Well-developed well-nourished symmetrical with no dependent edema.  NEUROLOGICAL: Awake alert oriented, facial expression symmetrical, moving all extremities.  REVIEW OF DATA: I have reviewed the following data today: Office Visit on 09/12/2021  Component Date Value   Diagnostic Interpretation 09/12/2021 Comment    Specimen adequacy: - Lab* 09/12/2021 Comment    Clinician provided ICD10* 09/12/2021 Comment    PERFORMED BY: - [facility] 09/12/2021 Comment    . - [facility] 09/12/2021 .    Note: - [facility]  09/12/2021 Comment    Test Method MT21 - [facility] 09/12/2021 Comment    HPV APTIMA - [facility] 09/12/2021 Negative    HPV Genotype Reflex - [facility] 09/12/2021 Comment   Office Visit on 08/26/2021  Component Date Value   Cholesterol, Total 08/26/2021 150    LDL Calculated 08/26/2021 64    HDL 08/26/2021 71    Triglyceride 08/26/2021 75    Sodium 08/26/2021 138    Potassium 08/26/2021 3.8    Chloride 08/26/2021 108    Carbon Dioxide (CO2) 08/26/2021 22    Urea Nitrogen (BUN) 08/26/2021 17    Creatinine 08/26/2021 0.9    Glucose 08/26/2021 92    Calcium 08/26/2021 9.6    AST (Aspartate Aminotran* 08/26/2021 17    ALT (Alanine Aminotransf* 08/26/2021 17    Bilirubin, Total 08/26/2021 0.4    Alk Phos (Alkaline Phosp* 08/26/2021 84    Albumin 08/26/2021 4.0    Protein, Total 08/26/2021 7.8    Anion Gap 08/26/2021 8    BUN/CREA Ratio 08/26/2021 19    Glomerular Filtration Ra* 08/26/2021 71      ASSESSMENT: Ms. Delamora is a 66 y.o. female presenting for consultation for DCIS of the left breast.    Patient was oriented  again about the pathology results. Surgical alternatives were discussed with patient including partial vs total mastectomy. Surgical technique and post operative care was discussed with patient. Risk of surgery was discussed with patient including but not limited to: wound infection, seroma, hematoma, brachial plexopathy, mondor's disease (thrombosis of small veins of breast), chronic wound pain, breast lymphedema, altered sensation to the nipple and cosmesis among others.   Ductal carcinoma in situ (DCIS) of left breast [D05.12]  PLAN: 1.  Radiofrequency tag partial mastectomy of the left breast (19301) 2.  CBC, CMP 3.  Avoid taking aspirin 5 days before the procedure 4.  Contact us if you have any concern  Patient verbalized understanding, all questions were answered, and were agreeable with the plan outlined above.     Herbert Pun, MD  Electronically signed by  Herbert Pun, MD

## 2021-10-28 ENCOUNTER — Other Ambulatory Visit: Payer: Self-pay

## 2021-10-28 ENCOUNTER — Encounter
Admission: RE | Admit: 2021-10-28 | Discharge: 2021-10-28 | Disposition: A | Discharge status: Home / Self Care | Payer: BC Managed Care – PPO | Source: Ambulatory Visit | Attending: General Surgery | Admitting: General Surgery

## 2021-10-28 ENCOUNTER — Telehealth: Payer: Self-pay

## 2021-10-28 VITALS — Ht 64.0 in | Wt 232.0 lb

## 2021-10-28 DIAGNOSIS — I10 Essential (primary) hypertension: Secondary | ICD-10-CM

## 2021-10-28 DIAGNOSIS — D0512 Intraductal carcinoma in situ of left breast: Secondary | ICD-10-CM

## 2021-10-28 NOTE — Telephone Encounter (Signed)
Research nurse called patient to talk with her about the [employer] 3505-A protocol and see if she has decided to participate in the study. Patient states she wants to participate and is coming to the breast clinic on Tuesday afternoon at 3:30 pm already, requested appointment around that time. Patient is being scheduled at 2:30 pm here in the cancer center for her consent and lab visit Tuesday at 2:30 pm. She states she has read the consent, denies any question at this time.  Jeral Fruit, RN 10/28/21 3:51 PM

## 2021-10-28 NOTE — Patient Instructions (Addendum)
Your procedure is scheduled on: 11/02/21 Report to [place]. To find out your arrival time please call [phone] between 1PM - 3PM on 11/01/21.  Remember: Instructions that are not followed completely may result in serious medical risk, up to and including death, or upon the discretion of your surgeon and anesthesiologist your surgery may need to be rescheduled.     _X__ 1. Do not eat food or drink any liquids after midnight the night before your procedure.                 No gum chewing or hard candies.   __X__2.  On the morning of surgery brush your teeth with toothpaste and water, you                 may rinse your mouth with mouthwash if you wish.  Do not swallow any              toothpaste of mouthwash.     _X__ 3.  No Alcohol for 24 hours before or after surgery.   _X__ 4.  Do Not Smoke or use e-cigarettes For 24 Hours Prior to Your Surgery.                 Do not use any chewable tobacco products for at least 6 hours prior to                 surgery.  ____  5.  Bring all medications with you on the day of surgery if instructed.   __X__  6.  Notify your doctor if there is any change in your medical condition      (cold, fever, infections).     Do not wear jewelry, make-up, hairpins, clips or nail polish. Do not wear lotions, powders, or perfumes. No deodorant Do not shave body hair 48 hours prior to surgery. Men may shave face and neck. Do not bring valuables to the hospital.    [facility] is not responsible for any belongings or valuables.  Contacts, dentures/partials or body piercings may not be worn into surgery. Bring a case for your contacts, glasses or hearing aids, a denture cup will be supplied. Leave your suitcase in the car. After surgery it may be brought to your room. For patients admitted to the hospital, discharge time is determined by your treatment team.   Patients discharged the day of surgery will  not be allowed to drive home.    __X__ Take these medicines the morning of surgery with A SIP OF WATER:    1. amLODipine (NORVASC) 10 MG tablet  2.   3.   4.  5.  6.  ____ Fleet Enema (as directed)   ____ Use CHG Soap/SAGE wipes as directed  ____ Use inhalers on the day of surgery  ____ Stop metformin/Janumet/Farxiga 2 days prior to surgery    ____ Take 1/2 of usual insulin dose the night before surgery. No insulin the morning          of surgery.   ____ Stop Blood Thinners Coumadin/Plavix/Xarelto/Pleta/Pradaxa/Eliquis/Effient/Aspirin  on   Or contact your Surgeon, Cardiologist or Medical Doctor regarding  ability to stop your blood thinners  __X__ Stop Anti-inflammatories 7 days before surgery such as Advil, Ibuprofen, Motrin,  BC or Goodies Powder, Naprosyn, Naproxen, Aleve, Aspirin    __X__ Stop all herbals and supplements, fish oil or vitamins  until after surgery.    ____  Bring C-Pap to the hospital.

## 2021-11-01 ENCOUNTER — Inpatient Hospital Stay: Payer: BC Managed Care – PPO

## 2021-11-01 ENCOUNTER — Ambulatory Visit
Admission: RE | Admit: 2021-11-01 | Discharge: 2021-11-01 | Disposition: A | Discharge status: Home / Self Care | Payer: BC Managed Care – PPO | Source: Ambulatory Visit | Attending: General Surgery | Admitting: General Surgery

## 2021-11-01 DIAGNOSIS — D0512 Intraductal carcinoma in situ of left breast: Secondary | ICD-10-CM | POA: Diagnosis present

## 2021-11-01 NOTE — Research (Unsigned)
MTG-015 - Tissue and Bodily Fluids: Translational Medicine: Discovery and Evaluation of Biomarkers/Pharmacogenomics for the Diagnosis and Personalized Management of Patients    11/01/2021  SECOND REVIEW:  This Coordinator has reviewed this patient's inclusion and exclusion criteria as a second review and confirms Chloe Jones is eligible for study participation.  Patient may continue with enrollment.   Carol Ada, RT(R)(T) Clinical Research Coordinator

## 2021-11-02 ENCOUNTER — Ambulatory Visit: Payer: BC Managed Care – PPO | Admitting: Certified Registered"

## 2021-11-02 ENCOUNTER — Other Ambulatory Visit: Payer: Self-pay

## 2021-11-02 ENCOUNTER — Ambulatory Visit
Admission: RE | Admit: 2021-11-02 | Discharge: 2021-11-02 | Disposition: A | Discharge status: Home / Self Care | Payer: BC Managed Care – PPO | Source: Ambulatory Visit | Attending: General Surgery | Admitting: General Surgery

## 2021-11-02 ENCOUNTER — Encounter: Payer: Self-pay | Admitting: General Surgery

## 2021-11-02 ENCOUNTER — Encounter
Admission: RE | Disposition: A | Discharge status: Home / Self Care | Payer: Self-pay | Source: Ambulatory Visit | Attending: General Surgery

## 2021-11-02 DIAGNOSIS — N6082 Other benign mammary dysplasias of left breast: Secondary | ICD-10-CM | POA: Diagnosis not present

## 2021-11-02 DIAGNOSIS — D0512 Intraductal carcinoma in situ of left breast: Secondary | ICD-10-CM | POA: Insufficient documentation

## 2021-11-02 DIAGNOSIS — N6042 Mammary duct ectasia of left breast: Secondary | ICD-10-CM | POA: Diagnosis not present

## 2021-11-02 DIAGNOSIS — I10 Essential (primary) hypertension: Secondary | ICD-10-CM

## 2021-11-02 SURGERY — PARTIAL MASTECTOMY WITH RADIO FREQUENCY LOCALIZER
Anesthesia: General | Site: Breast | Laterality: Left

## 2021-11-02 MED ORDER — FAMOTIDINE 20 MG PO TABS
20.0000 mg | ORAL_TABLET | Freq: Once | ORAL | Status: AC
  Administered 2021-11-02: 20 mg via ORAL

## 2021-11-02 MED ORDER — LIDOCAINE HCL (CARDIAC) PF 100 MG/5ML IV SOSY
PREFILLED_SYRINGE | INTRAVENOUS | Status: DC | PRN
  Administered 2021-11-02: 100 mg via INTRAVENOUS

## 2021-11-02 MED ORDER — HYDROCODONE-ACETAMINOPHEN 5-325 MG PO TABS: 1.0000 | ORAL_TABLET | ORAL | 0 refills | Status: AC | PRN

## 2021-11-02 MED ORDER — CEFAZOLIN SODIUM-DEXTROSE 2-4 GM/100ML-% IV SOLN
2.0000 g | INTRAVENOUS | Status: AC
  Administered 2021-11-02: 2 g via INTRAVENOUS

## 2021-11-02 MED ORDER — OXYCODONE HCL 5 MG/5ML PO SOLN: 5.0000 mg | Freq: Once | ORAL | Status: AC | PRN

## 2021-11-02 MED ORDER — CHLORHEXIDINE GLUCONATE 0.12 % MT SOLN
15.0000 mL | Freq: Once | OROMUCOSAL | Status: AC
  Administered 2021-11-02: 15 mL via OROMUCOSAL

## 2021-11-02 MED ORDER — PROPOFOL 10 MG/ML IV BOLUS
INTRAVENOUS | Status: DC | PRN
  Administered 2021-11-02: 200 mg via INTRAVENOUS
  Administered 2021-11-02: 50 mg via INTRAVENOUS

## 2021-11-02 MED ORDER — STERILE WATER FOR IRRIGATION IR SOLN
Status: DC | PRN
  Administered 2021-11-02: 200 mL

## 2021-11-02 MED ORDER — BUPIVACAINE-EPINEPHRINE (PF) 0.5% -1:200000 IJ SOLN
INTRAMUSCULAR | Status: AC
  Filled 2021-11-02: qty 30

## 2021-11-02 MED ORDER — CEFAZOLIN SODIUM-DEXTROSE 2-4 GM/100ML-% IV SOLN
INTRAVENOUS | Status: AC
  Filled 2021-11-02: qty 100

## 2021-11-02 MED ORDER — ONDANSETRON HCL 4 MG/2ML IJ SOLN
INTRAMUSCULAR | Status: DC | PRN
  Administered 2021-11-02: 4 mg via INTRAVENOUS

## 2021-11-02 MED ORDER — ONDANSETRON HCL 4 MG/2ML IJ SOLN: 4.0000 mg | Freq: Once | INTRAMUSCULAR | Status: DC | PRN

## 2021-11-02 MED ORDER — ORAL CARE MOUTH RINSE: 15.0000 mL | Freq: Once | OROMUCOSAL | Status: AC

## 2021-11-02 MED ORDER — FAMOTIDINE 20 MG PO TABS
ORAL_TABLET | ORAL | Status: AC
  Filled 2021-11-02: qty 1

## 2021-11-02 MED ORDER — FENTANYL CITRATE (PF) 100 MCG/2ML IJ SOLN
25.0000 ug | INTRAMUSCULAR | Status: DC | PRN
  Administered 2021-11-02 (×3): 25 ug via INTRAVENOUS

## 2021-11-02 MED ORDER — CHLORHEXIDINE GLUCONATE 0.12 % MT SOLN
OROMUCOSAL | Status: AC
  Filled 2021-11-02: qty 15

## 2021-11-02 MED ORDER — OXYCODONE HCL 5 MG PO TABS: 5.0000 mg | ORAL_TABLET | Freq: Once | ORAL | Status: AC | PRN

## 2021-11-02 MED ORDER — SUCCINYLCHOLINE CHLORIDE 200 MG/10ML IV SOSY
PREFILLED_SYRINGE | INTRAVENOUS | Status: DC | PRN
  Administered 2021-11-02: 80 mg via INTRAVENOUS

## 2021-11-02 MED ORDER — DEXAMETHASONE SODIUM PHOSPHATE 10 MG/ML IJ SOLN
INTRAMUSCULAR | Status: DC | PRN
  Administered 2021-11-02: 10 mg via INTRAVENOUS

## 2021-11-02 MED ORDER — BUPIVACAINE-EPINEPHRINE (PF) 0.5% -1:200000 IJ SOLN
INTRAMUSCULAR | Status: DC | PRN
  Administered 2021-11-02: 30 mL

## 2021-11-02 MED ORDER — PROPOFOL 1000 MG/100ML IV EMUL
INTRAVENOUS | Status: AC
  Filled 2021-11-02: qty 100

## 2021-11-02 MED ORDER — BUPIVACAINE LIPOSOME 1.3 % IJ SUSP
INTRAMUSCULAR | Status: AC
  Filled 2021-11-02: qty 20

## 2021-11-02 MED ORDER — MIDAZOLAM HCL 2 MG/2ML IJ SOLN
INTRAMUSCULAR | Status: DC | PRN
  Administered 2021-11-02: 2 mg via INTRAVENOUS

## 2021-11-02 MED ORDER — PHENYLEPHRINE HCL (PRESSORS) 10 MG/ML IV SOLN
INTRAVENOUS | Status: DC | PRN
  Administered 2021-11-02: 80 ug via INTRAVENOUS
  Administered 2021-11-02 (×3): 160 ug via INTRAVENOUS

## 2021-11-02 MED ORDER — SODIUM CHLORIDE (PF) 0.9 % IJ SOLN
INTRAMUSCULAR | Status: AC
  Filled 2021-11-02: qty 50

## 2021-11-02 MED ORDER — FENTANYL CITRATE (PF) 100 MCG/2ML IJ SOLN
INTRAMUSCULAR | Status: AC
  Administered 2021-11-02: 25 ug via INTRAVENOUS
  Filled 2021-11-02: qty 2

## 2021-11-02 MED ORDER — FENTANYL CITRATE (PF) 100 MCG/2ML IJ SOLN
INTRAMUSCULAR | Status: AC
  Filled 2021-11-02: qty 2

## 2021-11-02 MED ORDER — FENTANYL CITRATE (PF) 100 MCG/2ML IJ SOLN
INTRAMUSCULAR | Status: DC | PRN
  Administered 2021-11-02: 100 ug via INTRAVENOUS

## 2021-11-02 MED ORDER — LACTATED RINGERS IV SOLN: INTRAVENOUS | Status: DC

## 2021-11-02 MED ORDER — ACETAMINOPHEN 10 MG/ML IV SOLN: 1000.0000 mg | Freq: Once | INTRAVENOUS | Status: DC | PRN

## 2021-11-02 MED ORDER — OXYCODONE HCL 5 MG PO TABS
ORAL_TABLET | ORAL | Status: AC
  Administered 2021-11-02: 5 mg via ORAL
  Filled 2021-11-02: qty 1

## 2021-11-02 MED ORDER — DEXMEDETOMIDINE HCL IN NACL 80 MCG/20ML IV SOLN
INTRAVENOUS | Status: DC | PRN
  Administered 2021-11-02: 8 ug via BUCCAL
  Administered 2021-11-02: 12 ug via BUCCAL

## 2021-11-02 MED ORDER — MIDAZOLAM HCL 2 MG/2ML IJ SOLN
INTRAMUSCULAR | Status: AC
  Filled 2021-11-02: qty 2

## 2021-11-02 SURGICAL SUPPLY — 40 items
ADH SKN CLS APL DERMABOND .7 (GAUZE/BANDAGES/DRESSINGS) ×1
APL PRP STRL LF DISP 70% ISPRP (MISCELLANEOUS) ×1
BLADE SURG 15 STRL LF DISP TIS (BLADE) ×1 IMPLANT
BLADE SURG 15 STRL SS (BLADE) ×1
CHLORAPREP W/TINT 26 (MISCELLANEOUS) ×1 IMPLANT
DERMABOND ADVANCED .7 DNX12 (GAUZE/BANDAGES/DRESSINGS) ×1 IMPLANT
DEVICE DUBIN SPECIMEN MAMMOGRA (MISCELLANEOUS) ×1 IMPLANT
DRAPE LAPAROTOMY TRNSV 106X77 (MISCELLANEOUS) ×1 IMPLANT
ELECT CAUTERY BLADE TIP 2.5 (TIP) ×1
ELECT REM PT RETURN 9FT ADLT (ELECTROSURGICAL) ×1
ELECTRODE CAUTERY BLDE TIP 2.5 (TIP) ×1 IMPLANT
ELECTRODE REM PT RTRN 9FT ADLT (ELECTROSURGICAL) ×1 IMPLANT
GAUZE 4X4 16PLY ~~LOC~~+RFID DBL (SPONGE) ×1 IMPLANT
GLOVE BIO SURGEON STRL SZ 6.5 (GLOVE) ×1 IMPLANT
GLOVE BIOGEL PI IND STRL 6.5 (GLOVE) ×1 IMPLANT
GOWN STRL REUS W/ TWL LRG LVL3 (GOWN DISPOSABLE) ×3 IMPLANT
GOWN STRL REUS W/TWL LRG LVL3 (GOWN DISPOSABLE) ×3
KIT MARKER MARGIN INK (KITS) IMPLANT
KIT TURNOVER KIT A (KITS) ×1 IMPLANT
LABEL OR SOLS (LABEL) ×1 IMPLANT
MANIFOLD NEPTUNE II (INSTRUMENTS) ×1 IMPLANT
NDL HYPO 25X1 1.5 SAFETY (NEEDLE) ×1 IMPLANT
NEEDLE HYPO 25X1 1.5 SAFETY (NEEDLE) ×1 IMPLANT
PACK BASIN MINOR ARMC (MISCELLANEOUS) ×1 IMPLANT
RETRACTOR RING XSMALL (MISCELLANEOUS) IMPLANT
RTRCTR WOUND ALEXIS 13CM XS SH (MISCELLANEOUS) ×1
SUT MNCRL 4-0 (SUTURE) ×1
SUT MNCRL 4-0 27XMFL (SUTURE) ×1
SUT SILK 2 0 SH (SUTURE) ×1 IMPLANT
SUT VIC AB 2-0 SH 27 (SUTURE) ×1
SUT VIC AB 2-0 SH 27XBRD (SUTURE) IMPLANT
SUT VIC AB 3-0 SH 27 (SUTURE) ×1
SUT VIC AB 3-0 SH 27X BRD (SUTURE) ×1 IMPLANT
SUTURE MNCRL 4-0 27XMF (SUTURE) ×1 IMPLANT
SYR 10ML LL (SYRINGE) ×1 IMPLANT
SYR BULB IRRIG 60ML STRL (SYRINGE) ×1 IMPLANT
TRAP FLUID SMOKE EVACUATOR (MISCELLANEOUS) ×1 IMPLANT
TRAP NEPTUNE SPECIMEN COLLECT (MISCELLANEOUS) ×1 IMPLANT
VIC_Flex Guide Sheath Scout Drape IMPLANT
WATER STERILE IRR 500ML POUR (IV SOLUTION) ×1 IMPLANT

## 2021-11-02 NOTE — Discharge Instructions (Addendum)
  Diet: Resume home heart healthy regular diet.   Activity: Increase activity as tolerated. Light activity and walking are encouraged. Do not drive or drink alcohol if taking narcotic pain medications.  Wound care: May shower with soapy water and pat dry (do not rub incisions), but no baths or submerging incision underwater until follow-up. (no swimming)   Medications: Resume all home medications. For mild to moderate pain: acetaminophen (Tylenol) or ibuprofen (if no kidney disease). Combining Tylenol with alcohol can substantially increase your risk of causing liver disease. Narcotic pain medications, if prescribed, can be used for severe pain, though may cause nausea, constipation, and drowsiness. Do not combine Tylenol and Norco within a 6 hour period as Norco contains Tylenol. If you do not need the narcotic pain medication, you do not need to fill the prescription.  Call office ([phone]) at any time if any questions, worsening pain, fevers/chills, bleeding, drainage from incision site, or other concerns.   AMBULATORY SURGERY  DISCHARGE INSTRUCTIONS   The drugs that you were given will stay in your system until tomorrow so for the next 24 hours you should not:  Drive an automobile Make any legal decisions Drink any alcoholic beverage   You may resume regular meals tomorrow.  Today it is better to start with liquids and gradually work up to solid foods.  You may eat anything you prefer, but it is better to start with liquids, then soup and crackers, and gradually work up to solid foods.   Please notify your doctor immediately if you have any unusual bleeding, trouble breathing, redness and pain at the surgery site, drainage, fever, or pain not relieved by medication.    Additional Instructions:        Please contact your physician with any problems or Same Day Surgery at [phone], Monday through Friday 6 am to 4 pm, or [place] [facility] [place] Main number at  [phone]. 

## 2021-11-02 NOTE — Anesthesia Postprocedure Evaluation (Signed)
Anesthesia Post Note  Patient: Chloe Jones  Procedure(s) Performed: PARTIAL MASTECTOMY WITH RADIO FREQUENCY LOCALIZER (Left: Breast)  Patient location during evaluation: PACU Anesthesia Type: General Level of consciousness: awake and alert Pain management: pain level controlled Vital Signs Assessment: post-procedure vital signs reviewed and stable Respiratory status: spontaneous breathing, nonlabored ventilation, respiratory function stable and patient connected to nasal cannula oxygen Cardiovascular status: blood pressure returned to baseline and stable Postop Assessment: no apparent nausea or vomiting Anesthetic complications: no   No notable events documented.   Last Vitals:  Vitals:   11/02/21 1520 11/02/21 1528  BP:  111/74  Pulse: (!) 59 (!) 57  Resp: 17 20  Temp:  36.8 C  SpO2: 98% 96%    Last Pain:  Vitals:   11/02/21 1528  TempSrc: Temporal  PainSc: 3                  Arita Miss

## 2021-11-02 NOTE — Anesthesia Preprocedure Evaluation (Signed)
Anesthesia Evaluation  Patient identified by MRN, date of birth, ID band Patient awake    Reviewed: Allergy & Precautions, NPO status , Patient's Chart, lab work & pertinent test results  History of Anesthesia Complications Negative for: history of anesthetic complications  Airway Mallampati: II  TM Distance: >3 FB Neck ROM: Full    Dental no notable dental hx. (+) Teeth Intact   Pulmonary neg pulmonary ROS, neg sleep apnea, neg COPD, Patient abstained from smoking.Not current smoker, former smoker,    Pulmonary exam normal breath sounds clear to auscultation       Cardiovascular Exercise Tolerance: Good METShypertension, Pt. on medications (-) CAD and (-) Past MI (-) dysrhythmias  Rhythm:Regular Rate:Normal - Systolic murmurs    Neuro/Psych negative neurological ROS  negative psych ROS   GI/Hepatic neg GERD  ,(+)     (-) substance abuse  ,   Endo/Other  neg diabetesMorbid obesity  Renal/GU negative Renal ROS     Musculoskeletal   Abdominal (+) + obese,   Peds  Hematology   Anesthesia Other Findings Past Medical History: 10/25/2021: Ductal carcinoma in situ (DCIS) of left breast No date: Hypertension  Reproductive/Obstetrics                             Anesthesia Physical Anesthesia Plan  ASA: 3  Anesthesia Plan: General   Post-op Pain Management: Ofirmev IV (intra-op)* and Toradol IV (intra-op)*   Induction: Intravenous  PONV Risk Score and Plan: 3 and Ondansetron, Dexamethasone and Midazolam  Airway Management Planned: Oral ETT  Additional Equipment: None  Intra-op Plan:   Post-operative Plan: Extubation in OR  Informed Consent: I have reviewed the patients History and Physical, chart, labs and discussed the procedure including the risks, benefits and alternatives for the proposed anesthesia with the patient or authorized representative who has indicated his/her  understanding and acceptance.     Dental advisory given  Plan Discussed with: CRNA and Surgeon  Anesthesia Plan Comments: (Discussed risks of anesthesia with patient, including PONV, sore throat, lip/dental/eye damage. Rare risks discussed as well, such as cardiorespiratory and neurological sequelae, and allergic reactions. Discussed the role of CRNA in patient's perioperative care. Patient understands.)        Anesthesia Quick Evaluation

## 2021-11-02 NOTE — Op Note (Signed)
Preoperative diagnosis: Left breast ductal carcinoma in situ.  Postoperative diagnosis: Same.   Procedure: Left radiofrequency tag-localized partial mastectomy.                       Anesthesia: GETA  Surgeon: Dr. Windell Moment  Wound Classification: Clean  Indications: Patient is a 66 y.o. female with a nonpalpable left breast mass noted on mammography with core biopsy demonstrating ductal carcinoma in situ requires radiofrequency tag-localized partial mastectomy for treatment.   Findings: 1. Specimen mammography shows marker and tag on specimen 2. Pathology call refers gross examination of margins was grossly negative 3. No other palpable mass or lymph node identified.   Description of procedure: Preoperative radiofrequency tag localization was performed by radiology. Localization studies were reviewed. The patient was taken to the operating room and placed supine on the operating table, and after general anesthesia the left chest and axilla were prepped and draped in the usual sterile fashion. A time-out was completed verifying correct patient, procedure, site, positioning, and implant(s) and/or special equipment prior to beginning this procedure.  By comparing the localization studies and interrogation with [facility] scout device, the probable trajectory and location of the mass was visualized. A circumareolar skin incision was planned in such a way as to minimize the amount of dissection to reach the mass.  The skin incision was made. Flaps were raised and the location of the tag was confirmed with [facility] device confirmed. A 2-0 silk figure-of-eight stay suture was placed and used for retraction. Dissection was then taken down circumferentially, taking care to include the entire localizing tag and a wide margin of grossly normal tissue. The specimen and entire localizing tag were removed. The specimen was oriented and sent to radiology with the localization studies. Confirmation was received  that the entire target lesion had been resected. The wound was irrigated. Hemostasis was checked.   The breast wound was then approached for closure.  Eliminate dead space a tissue transfer technique was utilized.  The breast and pectoralis fascia was elevated off the underlying muscle and the serratus muscle circumferentially for a distance of about 30 sq cm.  The fascial layer was then approximated with interrupted 2-0 Vicryl sutures.  The superficial layer of the breast parenchyma was then approximated in a similar fashion.  This was done in a radial direction.  The skin flaps were then elevated circumferentially to remove a ripple noted superiorly and medially. The skin was closed with 4-0 Monocryl. Dermabond was applied.  Specimen: Left Breast mass                       Complications: None  Estimated Blood Loss: 5 mL

## 2021-11-02 NOTE — Interval H&P Note (Signed)
History and Physical Interval Note:  11/02/2021 12:27 PM  Chloe Jones  has presented today for surgery, with the diagnosis of Ductal carcinoma in situ (DCIS) of left breast D05.12.  The various methods of treatment have been discussed with the patient and family. After consideration of risks, benefits and other options for treatment, the patient has consented to  Procedure(s): PARTIAL MASTECTOMY WITH RADIO FREQUENCY LOCALIZER (Left) as a surgical intervention.  The patient's history has been reviewed, patient examined, no change in status, stable for surgery.  I have reviewed the patient's chart and labs.  Questions were answered to the patient's satisfaction.     Herbert Pun

## 2021-11-02 NOTE — Transfer of Care (Signed)
Immediate Anesthesia Transfer of Care Note  Patient: Jabier Mutton Priestly  Procedure(s) Performed: PARTIAL MASTECTOMY WITH RADIO FREQUENCY LOCALIZER (Left: Breast)  Patient Location: PACU  Anesthesia Type:General  Level of Consciousness: drowsy  Airway & Oxygen Therapy: Patient Spontanous Breathing  Post-op Assessment: Report given to RN and Post -op Vital signs reviewed and stable  Post vital signs: Reviewed and stable  Last Vitals:  Vitals Value Taken Time  BP 100/62 11/02/21 1433  Temp 36.2 C 11/02/21 1433  Pulse 75 11/02/21 1439  Resp 14 11/02/21 1439  SpO2 95 % 11/02/21 1439  Vitals shown include unvalidated device data.  Last Pain:  Vitals:   11/02/21 1433  TempSrc:   PainSc: 6          Complications: No notable events documented.

## 2021-11-02 NOTE — Anesthesia Procedure Notes (Signed)
Procedure Name: Intubation Date/Time: 11/02/2021 1:07 PM  Performed by: Beverely Low, CRNAPre-anesthesia Checklist: Patient identified, Patient being monitored, Timeout performed, Emergency Drugs available and Suction available Patient Re-evaluated:Patient Re-evaluated prior to induction Oxygen Delivery Method: Circle system utilized Preoxygenation: Pre-oxygenation with 100% oxygen Induction Type: IV induction Ventilation: Mask ventilation without difficulty Laryngoscope Size: 3 and McGraph Grade View: Grade I Tube type: Oral Tube size: 6.5 mm Number of attempts: 1 Airway Equipment and Method: Stylet Placement Confirmation: ETT inserted through vocal cords under direct vision, positive ETCO2 and breath sounds checked- equal and bilateral Secured at: 21 cm Tube secured with: Tape Dental Injury: Teeth and Oropharynx as per pre-operative assessment

## 2021-11-03 ENCOUNTER — Telehealth: Payer: Self-pay | Admitting: *Deleted

## 2021-11-03 NOTE — Telephone Encounter (Signed)
Patient called asking for a note for work for her procedure yesterday and asked that it be sent through [facility]

## 2021-11-03 NOTE — Telephone Encounter (Signed)
Call returned to patient and advised that she needs to call surgeon for her note and she asked for his contact number and I gave it to her

## 2021-11-04 LAB — SURGICAL PATHOLOGY

## 2021-11-07 LAB — SURGICAL PATHOLOGY

## 2021-11-08 ENCOUNTER — Other Ambulatory Visit: Payer: Self-pay | Admitting: General Surgery

## 2021-11-16 ENCOUNTER — Inpatient Hospital Stay (HOSPITAL_BASED_OUTPATIENT_CLINIC_OR_DEPARTMENT_OTHER): Payer: BC Managed Care – PPO | Admitting: Oncology

## 2021-11-16 ENCOUNTER — Ambulatory Visit
Admission: RE | Admit: 2021-11-16 | Discharge: 2021-11-16 | Disposition: A | Discharge status: Home / Self Care | Payer: BC Managed Care – PPO | Source: Ambulatory Visit | Attending: Radiation Oncology | Admitting: Radiation Oncology

## 2021-11-16 ENCOUNTER — Encounter: Payer: Self-pay | Admitting: Oncology

## 2021-11-16 VITALS — BP 107/68 | HR 79 | Temp 99.5°F | Resp 16 | Ht 64.0 in | Wt 238.0 lb

## 2021-11-16 DIAGNOSIS — Z79899 Other long term (current) drug therapy: Secondary | ICD-10-CM | POA: Diagnosis not present

## 2021-11-16 DIAGNOSIS — Z170 Estrogen receptor positive status [ER+]: Secondary | ICD-10-CM | POA: Insufficient documentation

## 2021-11-16 DIAGNOSIS — Z87891 Personal history of nicotine dependence: Secondary | ICD-10-CM | POA: Diagnosis not present

## 2021-11-16 DIAGNOSIS — D0512 Intraductal carcinoma in situ of left breast: Secondary | ICD-10-CM

## 2021-11-16 DIAGNOSIS — I10 Essential (primary) hypertension: Secondary | ICD-10-CM | POA: Diagnosis not present

## 2021-11-16 NOTE — Progress Notes (Signed)
[place]  [phone]) [phone] Fax:(336) [phone]  ID: Chloe Jones OB: 1955/09/12  MR#: [id number]  ZMO#:[id number]  Patient Care Team: [facility], [facility] as PCP - General Laurance Flatten, Nelwyn Salisbury, RN as Oncology Nurse Navigator  CHIEF COMPLAINT: DCIS left breast  INTERVAL HISTORY: Patient returns to clinic today for further evaluation, discussion of her final pathology results, and treatment planning.  She underwent lumpectomy on November 02, 2021 and tolerated the procedure well.  There was no residual DCIS noted in pathology specimen.  She currently feels well and is asymptomatic.  She has no neurologic complaints.  She denies any recent fevers or illnesses.  She has a good appetite and denies weight loss.  She has no chest pain, shortness of breath, cough, or chills.  She denies any nausea, vomiting, constipation, or diarrhea.  She has no urinary complaints.  Patient offers no specific complaints today.  REVIEW OF SYSTEMS:   Review of Systems  Constitutional: Negative.  Negative for fever, malaise/fatigue and weight loss.  Respiratory: Negative.  Negative for cough, hemoptysis and shortness of breath.   Cardiovascular: Negative.  Negative for chest pain and leg swelling.  Gastrointestinal: Negative.  Negative for abdominal pain.  Genitourinary: Negative.  Negative for dysuria.  Musculoskeletal: Negative.  Negative for back pain.  Skin: Negative.  Negative for rash.  Neurological: Negative.  Negative for dizziness, focal weakness, weakness and headaches.  Psychiatric/Behavioral: Negative.  The patient is not nervous/anxious.     As per HPI. Otherwise, a complete review of systems is negative.  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   Hypertension     PAST SURGICAL HISTORY: Past Surgical History:  Procedure Laterality Date   BREAST BIOPSY Left 10/19/2021   Left  Breast stereo bx, X clip path pending   COLONOSCOPY      FAMILY HISTORY: Family History  Problem Relation Age of Onset   Breast cancer Neg Hx     ADVANCED DIRECTIVES (Y/N):  N  HEALTH MAINTENANCE: Social History   Tobacco Use   Smoking status: Former    Types: Cigarettes   Smokeless tobacco: Never  Vaping Use   Vaping Use: Never used  Substance Use Topics   Alcohol use: Yes    Comment: social   Drug use: Not Currently     Colonoscopy:  PAP:  Bone density:  Lipid panel:  No Known Allergies  Current Outpatient Medications  Medication Sig Dispense Refill   amLODipine (NORVASC) 10 MG tablet Take 10 mg by mouth daily.     losartan (COZAAR) 25 MG tablet Take 25 mg by mouth daily.     Multiple Vitamin (MULTIVITAMIN) tablet Take 2 tablets by mouth daily. Beet Gummies     Multiple Vitamins-Minerals (DAILY COMBO MULTI VITAMINS PO) Take 2 capsules by mouth 1 day or 1 dose.     No current facility-administered medications for this visit.    OBJECTIVE: Vitals:   11/16/21 1011  BP: 107/68  Pulse: 79  Resp: 16  Temp: 99.5 F (37.5 C)  SpO2: 97%     Body mass index is 40.85 kg/m.    ECOG FS:0 - Asymptomatic  General: Well-developed, well-nourished, no acute distress. Eyes: Pink conjunctiva, anicteric sclera. HEENT: Normocephalic, moist mucous membranes. Breast: Exam deferred today. Lungs: No audible wheezing or coughing. Heart: Regular rate and rhythm. Abdomen: Soft, nontender, no obvious distention. Musculoskeletal: No edema, cyanosis, or clubbing. Neuro: Alert, answering  all questions appropriately. Cranial nerves grossly intact. Skin: No rashes or petechiae noted. Psych: Normal affect.  LAB RESULTS:  No results found for: "NA", "K", "CL", "CO2", "GLUCOSE", "BUN", "CREATININE", "CALCIUM", "PROT", "ALBUMIN", "AST", "ALT", "ALKPHOS", "BILITOT", "GFRNONAA", "GFRAA"  No results found for: "WBC", "NEUTROABS", "HGB", "HCT", "MCV", "PLT"   STUDIES: MM Breast  Surgical Specimen  Result Date: 11/02/2021 CLINICAL DATA:  Status post [facility] localized left breast lumpectomy. EXAM: SPECIMEN RADIOGRAPH OF THE LEFT BREAST COMPARISON:  Previous exam(s). FINDINGS: Status post excision of the left breast. The [facility] reflector and X shaped clip are present within the specimen. IMPRESSION: Specimen radiograph of the left breast. Electronically Signed   By: Beryle Flock M.D.   On: 11/02/2021 13:59  MM LT RADIO FREQUENCY TAG LOC MAMMO GUIDE  Result Date: 11/01/2021 CLINICAL DATA:  66 year old female with high-grade left breast DCIS presents for preoperative SAVI scout localization. EXAM: NEEDLE LOCALIZATION OF THE LEFT BREAST WITH MAMMO GUIDANCE COMPARISON:  Previous exams. FINDINGS: Patient presents for needle localization prior to left breast lumpectomy. I met with the patient and we discussed the procedure of needle localization including benefits and alternatives. We discussed the high likelihood of a successful procedure. We discussed the risks of the procedure, including infection, bleeding, tissue injury, and further surgery. Informed, written consent was given. The usual time-out protocol was performed immediately prior to the procedure. Using mammographic guidance, sterile technique, 1% lidocaine and a 10 cm SAVI SCOUT needle, the the X shaped biopsy marking clip at site of biopsy proven DCIS was localized using a medial to lateral approach. Reflector function was confirmed with an auditory signal from the SAVI SCOUT guide. The images were marked for Dr. Windell Moment. IMPRESSION: Radar reflector localization of the left breast. No apparent complications. Electronically Signed   By: Everlean Alstrom M.D.   On: 11/01/2021 16:07  MM LT BREAST BX W LOC DEV 1ST LESION IMAGE BX SPEC STEREO GUIDE  Addendum Date: 10/21/2021   ADDENDUM REPORT: 10/21/2021 09:08 ADDENDUM: Pathology revealed DUCTAL CARCINOMA IN SITU (DCIS), HIGH-GRADE, COMEDO TYPE WITH ASSOCIATED  CALCIFICATIONS of the LEFT breast, inner aspect (x clip). This was found to be concordant by Dr. Valentino Saxon. Pathology results were discussed with the patient by telephone. The patient reported doing well after the biopsy with tenderness at the site. Post biopsy instructions were reviewed and questions were answered. The patient was encouraged to call [place] [facility] for any additional concerns. Reports and recommendations sent to [place], Oncology Nurse Navigator at [facility] for surgical / oncology referral. Consider Breast MRI with and without contrast given high grade histology. Pathology results reported by Stacie [place] RN on 10/20/2021. Electronically Signed   By: Valentino Saxon M.D.   On: 10/21/2021 09:08   Result Date: 10/21/2021 CLINICAL DATA:  Indeterminate calcifications EXAM: LEFT BREAST STEREOTACTIC CORE NEEDLE BIOPSY COMPARISON:  Previous exam(s). FINDINGS: The patient and I discussed the procedure of stereotactic-guided biopsy including benefits and alternatives. We discussed the high likelihood of a successful procedure. We discussed the risks of the procedure including infection, bleeding, tissue injury, clip migration, and inadequate sampling. Informed written consent was given. The usual time out protocol was performed immediately prior to the procedure. Using sterile technique and 1% lidocaine and 1% lidocaine with epinephrine as local anesthetic, under stereotactic guidance, a 9 gauge vacuum assisted device was used to perform core needle biopsy of calcifications in the inner aspect of the left breast using a  superior approach. Specimen radiograph was performed showing representative calcifications in 5 of 6 samples. Specimens with calcifications are identified for pathology. Lesion quadrant: Upper inner quadrant At the conclusion of the procedure, an X shaped tissue marker clip was deployed into the biopsy cavity. Follow-up  2-view mammogram was performed and dictated separately. IMPRESSION: Stereotactic-guided biopsy of indeterminate calcifications. No apparent complications. Electronically Signed: By: Valentino Saxon M.D. On: 10/19/2021 10:09   MM CLIP PLACEMENT LEFT  Result Date: 10/19/2021 CLINICAL DATA:  Indeterminate calcifications. Status post stereotactic guided biopsy EXAM: 3D DIAGNOSTIC LEFT MAMMOGRAM POST STEREOTACTIC BIOPSY COMPARISON:  Previous exam(s). FINDINGS: 3D Mammographic images were obtained following stereotactic guided biopsy of calcifications. The X shaped biopsy marking clip is in expected position at the site of biopsy. IMPRESSION: Appropriate positioning of the X shaped biopsy marking clip at the site of biopsy in the inner breast. Final Assessment: Post Procedure Mammograms for Marker Placement Electronically Signed   By: Valentino Saxon M.D.   On: 10/19/2021 10:10   ASSESSMENT: DCIS left breast.  PLAN:    DCIS left breast: Pathology and imaging reviewed independently.  Lumpectomy on November 02, 2020 did not reveal any residual DCIS.  Given the noninvasive nature of the lesion, she does not require adjuvant chemotherapy.  Patient has an appointment with radiation oncology today for consideration of adjuvant XRT.  She will benefit from 5 years of tamoxifen until inclusion of her treatments.  Follow-up at the end of radiation for further evaluation and initiation of treatment.    I spent a total of 30 minutes reviewing chart data, face-to-face evaluation with the patient, counseling and coordination of care as detailed above.   Patient expressed understanding and was in agreement with this plan. She also understands that She can call clinic at any time with any questions, concerns, or complaints.    Cancer Staging  Ductal carcinoma in situ (DCIS) of left breast Staging form: Breast, AJCC 8th Edition - Clinical stage from 10/28/2021: Stage 0 (cTis (DCIS), cN0, cM0, ER+, PR: Not Assessed,  HER2: Not Assessed) - Signed by Lloyd Huger, MD on 10/28/2021 Stage prefix: Initial diagnosis   Lloyd Huger, MD   11/17/2021 6:58 AM

## 2021-11-16 NOTE — Consult Note (Signed)
NEW PATIENT EVALUATION  Name: Chloe Jones  MRN: [id number]  Date:   11/16/2021     DOB: 1955/06/09   This 66 y.o. 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  REFERRING PHYSICIAN: [place]: No chief complaint on file.   DIAGNOSIS: The encounter diagnosis was Ductal carcinoma in situ (DCIS) of left breast.   PREVIOUS INVESTIGATIONS:  Mammogram and ultrasound reviewed Clinical notes reviewed Pathology reports reviewed  HPI: Patient is a 66 year old female found to have an abnormal mammogram showing 3 mm group of indeterminate calcifications in the left breast in the inner breast mid depth.  Underwent stereotactic guided biopsy showing high-grade ductal carcinoma in situ with comedonecrosis.  She underwent a wide local excision with no residual DCIS noted.  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  She is tolerated her surgery well.  Her ER status I believe is still pending and that will be a determination whether she receives endocrine therapy by medical oncology.  She is now referred to radiation collagen for consideration of treatment.  PLANNED TREATMENT REGIMEN: Left breast partial breast radiation  PAST MEDICAL HISTORY:  has a past medical history of Ductal carcinoma in situ (DCIS) of left breast (10/25/2021) and Hypertension.    PAST SURGICAL HISTORY:  Past Surgical History:  Procedure Laterality Date   BREAST BIOPSY Left 10/19/2021   Left Breast stereo bx, X clip path pending   COLONOSCOPY      FAMILY HISTORY: family history is not on file.  SOCIAL HISTORY:  reports that she has quit smoking. Her smoking use included cigarettes. She has never used smokeless tobacco. She reports current alcohol use. She reports that she does not currently use drugs.  ALLERGIES: Patient has no  known allergies.  MEDICATIONS:  Current Outpatient Medications  Medication Sig Dispense Refill   amLODipine (NORVASC) 10 MG tablet Take 10 mg by mouth daily.     losartan (COZAAR) 25 MG tablet Take 25 mg by mouth daily.     Multiple Vitamin (MULTIVITAMIN) tablet Take 2 tablets by mouth daily. Beet Gummies     Multiple Vitamins-Minerals (DAILY COMBO MULTI VITAMINS PO) Take 2 capsules by mouth 1 day or 1 dose.     No current facility-administered medications for this encounter.    ECOG PERFORMANCE STATUS:  0 - Asymptomatic  REVIEW OF SYSTEMS: Patient denies any weight loss, fatigue, weakness, fever, chills or night sweats. Patient denies any loss of vision, blurred vision. Patient denies any ringing  of the ears or hearing loss. No irregular heartbeat. Patient denies heart murmur or history of fainting. Patient denies any chest pain or pain radiating to her upper extremities. Patient denies any shortness of breath, difficulty breathing at night, cough or hemoptysis. Patient denies any swelling in the lower legs. Patient denies any nausea vomiting, vomiting of blood, or coffee ground material in the vomitus. Patient denies any stomach pain. Patient states has had normal bowel movements no significant constipation or diarrhea. Patient denies any dysuria, hematuria or significant nocturia. Patient denies any problems walking, swelling in the joints or loss of balance. Patient denies any skin changes, loss of hair or loss of weight. Patient denies any excessive worrying or anxiety or significant depression. Patient denies any problems with insomnia. Patient denies excessive thirst, polyuria, polydipsia. Patient denies any swollen glands, patient denies easy bruising or easy  bleeding. Patient denies any recent infections, allergies or URI. Patient "s visual fields have not changed significantly in recent time.   PHYSICAL EXAM: There were no vitals taken for this visit.  Patient has large pendulous  breasts. Status post wide local excision left breast which is well-healed no dominant masses noted in either breast no axillary or supraclavicular adenopathy is appreciated.  Well-developed well-nourished patient in NAD. HEENT reveals PERLA, EOMI, discs not visualized.  Oral cavity is clear. No oral mucosal lesions are identified. Neck is clear without evidence of cervical or supraclavicular adenopathy. Lungs are clear to A&P. Cardiac examination is essentially unremarkable with regular rate and rhythm without murmur rub or thrill. Abdomen is benign with no organomegaly or masses noted. Motor sensory and DTR levels are equal and symmetric in the upper and lower extremities. Cranial nerves II through XII are grossly intact. Proprioception is intact. No peripheral adenopathy or edema is identified. No motor or sensory levels are noted. Crude visual fields are within normal range.  LABORATORY DATA: Pathology reports reviewed    RADIOLOGY RESULTS: Mammogram and ultrasound reviewed compatible with above-stated findings   IMPRESSION: Ductal carcinoma in situ of the left breast status post wide local excision in 66 year old female  PLAN: At this time I am offering partial breast irradiation.  To her left breast.  Plan on delivering 35 Gray in 10 fractions.  Would use IMRT treatment planning to deliver dose homogeneity to the area of resection.  Risks and benefits of treatment including skin reaction fatigue all were discussed in detail with the patient.  I have personally set up and ordered CT simulation for next week.  Patient comprehends my treatment plan well.  I would like to take this opportunity to thank you for allowing me to participate in the care of your patient.Noreene Filbert, MD

## 2021-11-23 ENCOUNTER — Ambulatory Visit
Admission: RE | Admit: 2021-11-23 | Discharge: 2021-11-23 | Disposition: A | Discharge status: Home / Self Care | Payer: BC Managed Care – PPO | Source: Ambulatory Visit | Attending: Radiation Oncology | Admitting: Radiation Oncology

## 2021-11-23 ENCOUNTER — Encounter: Payer: Self-pay | Admitting: *Deleted

## 2021-11-23 DIAGNOSIS — Z79899 Other long term (current) drug therapy: Secondary | ICD-10-CM | POA: Insufficient documentation

## 2021-11-23 DIAGNOSIS — Z170 Estrogen receptor positive status [ER+]: Secondary | ICD-10-CM | POA: Insufficient documentation

## 2021-11-23 DIAGNOSIS — Z87891 Personal history of nicotine dependence: Secondary | ICD-10-CM | POA: Insufficient documentation

## 2021-11-23 DIAGNOSIS — I10 Essential (primary) hypertension: Secondary | ICD-10-CM | POA: Insufficient documentation

## 2021-11-23 DIAGNOSIS — D0512 Intraductal carcinoma in situ of left breast: Secondary | ICD-10-CM | POA: Insufficient documentation

## 2021-11-23 NOTE — Progress Notes (Signed)
Met with patient during CT Sim.  Appt given for follow up with Dr. Grayland Ormond after radiation is complete.

## 2021-11-30 DIAGNOSIS — D0512 Intraductal carcinoma in situ of left breast: Secondary | ICD-10-CM | POA: Diagnosis not present

## 2021-12-02 ENCOUNTER — Other Ambulatory Visit: Payer: Self-pay | Admitting: *Deleted

## 2021-12-02 DIAGNOSIS — D0512 Intraductal carcinoma in situ of left breast: Secondary | ICD-10-CM

## 2021-12-06 ENCOUNTER — Ambulatory Visit
Admission: RE | Admit: 2021-12-06 | Discharge: 2021-12-06 | Disposition: A | Discharge status: Home / Self Care | Payer: BC Managed Care – PPO | Source: Ambulatory Visit | Attending: Radiation Oncology | Admitting: Radiation Oncology

## 2021-12-06 ENCOUNTER — Other Ambulatory Visit: Payer: Self-pay

## 2021-12-06 DIAGNOSIS — D0512 Intraductal carcinoma in situ of left breast: Secondary | ICD-10-CM | POA: Diagnosis not present

## 2021-12-06 LAB — RAD ONC ARIA SESSION SUMMARY
Course Elapsed Days: 0
Plan Fractions Treated to Date: 1
Plan Prescribed Dose Per Fraction: 3.5 Gy
Plan Total Fractions Prescribed: 10
Plan Total Prescribed Dose: 35 Gy
Reference Point Dosage Given to Date: 3.5 Gy
Reference Point Session Dosage Given: 3.5 Gy
Session Number: 1

## 2021-12-07 ENCOUNTER — Ambulatory Visit
Admission: RE | Admit: 2021-12-07 | Discharge: 2021-12-07 | Disposition: A | Discharge status: Home / Self Care | Payer: BC Managed Care – PPO | Source: Ambulatory Visit | Attending: Radiation Oncology | Admitting: Radiation Oncology

## 2021-12-07 ENCOUNTER — Other Ambulatory Visit: Payer: Self-pay

## 2021-12-07 DIAGNOSIS — D0512 Intraductal carcinoma in situ of left breast: Secondary | ICD-10-CM | POA: Diagnosis not present

## 2021-12-07 LAB — RAD ONC ARIA SESSION SUMMARY
Course Elapsed Days: 1
Plan Fractions Treated to Date: 2
Plan Prescribed Dose Per Fraction: 3.5 Gy
Plan Total Fractions Prescribed: 10
Plan Total Prescribed Dose: 35 Gy
Reference Point Dosage Given to Date: 7 Gy
Reference Point Session Dosage Given: 3.5 Gy
Session Number: 2

## 2021-12-08 ENCOUNTER — Other Ambulatory Visit: Payer: Self-pay

## 2021-12-08 ENCOUNTER — Ambulatory Visit
Admission: RE | Admit: 2021-12-08 | Discharge: 2021-12-08 | Disposition: A | Discharge status: Home / Self Care | Payer: BC Managed Care – PPO | Source: Ambulatory Visit | Attending: Radiation Oncology | Admitting: Radiation Oncology

## 2021-12-08 DIAGNOSIS — D0512 Intraductal carcinoma in situ of left breast: Secondary | ICD-10-CM | POA: Diagnosis not present

## 2021-12-08 LAB — RAD ONC ARIA SESSION SUMMARY
Course Elapsed Days: 2
Plan Fractions Treated to Date: 3
Plan Prescribed Dose Per Fraction: 3.5 Gy
Plan Total Fractions Prescribed: 10
Plan Total Prescribed Dose: 35 Gy
Reference Point Dosage Given to Date: 10.5 Gy
Reference Point Session Dosage Given: 3.5 Gy
Session Number: 3

## 2021-12-09 ENCOUNTER — Other Ambulatory Visit: Payer: Self-pay

## 2021-12-09 ENCOUNTER — Ambulatory Visit
Admission: RE | Admit: 2021-12-09 | Discharge: 2021-12-09 | Disposition: A | Discharge status: Home / Self Care | Payer: BC Managed Care – PPO | Source: Ambulatory Visit | Attending: Radiation Oncology | Admitting: Radiation Oncology

## 2021-12-09 DIAGNOSIS — D0512 Intraductal carcinoma in situ of left breast: Secondary | ICD-10-CM | POA: Diagnosis not present

## 2021-12-09 LAB — RAD ONC ARIA SESSION SUMMARY
Course Elapsed Days: 3
Plan Fractions Treated to Date: 4
Plan Prescribed Dose Per Fraction: 3.5 Gy
Plan Total Fractions Prescribed: 10
Plan Total Prescribed Dose: 35 Gy
Reference Point Dosage Given to Date: 14 Gy
Reference Point Session Dosage Given: 3.5 Gy
Session Number: 4

## 2021-12-12 ENCOUNTER — Other Ambulatory Visit: Payer: Self-pay

## 2021-12-12 ENCOUNTER — Ambulatory Visit
Admission: RE | Admit: 2021-12-12 | Discharge: 2021-12-12 | Disposition: A | Discharge status: Home / Self Care | Payer: BC Managed Care – PPO | Source: Ambulatory Visit | Attending: Radiation Oncology | Admitting: Radiation Oncology

## 2021-12-12 DIAGNOSIS — D0512 Intraductal carcinoma in situ of left breast: Secondary | ICD-10-CM | POA: Diagnosis not present

## 2021-12-12 LAB — RAD ONC ARIA SESSION SUMMARY
Course Elapsed Days: 6
Plan Fractions Treated to Date: 5
Plan Prescribed Dose Per Fraction: 3.5 Gy
Plan Total Fractions Prescribed: 10
Plan Total Prescribed Dose: 35 Gy
Reference Point Dosage Given to Date: 17.5 Gy
Reference Point Session Dosage Given: 3.5 Gy
Session Number: 5

## 2021-12-13 ENCOUNTER — Other Ambulatory Visit: Payer: Self-pay

## 2021-12-13 ENCOUNTER — Ambulatory Visit
Admission: RE | Admit: 2021-12-13 | Discharge: 2021-12-13 | Disposition: A | Discharge status: Home / Self Care | Payer: BC Managed Care – PPO | Source: Ambulatory Visit | Attending: Radiation Oncology | Admitting: Radiation Oncology

## 2021-12-13 ENCOUNTER — Inpatient Hospital Stay: Payer: BC Managed Care – PPO

## 2021-12-13 DIAGNOSIS — D0512 Intraductal carcinoma in situ of left breast: Secondary | ICD-10-CM | POA: Insufficient documentation

## 2021-12-13 LAB — RAD ONC ARIA SESSION SUMMARY
Course Elapsed Days: 7
Plan Fractions Treated to Date: 6
Plan Prescribed Dose Per Fraction: 3.5 Gy
Plan Total Fractions Prescribed: 10
Plan Total Prescribed Dose: 35 Gy
Reference Point Dosage Given to Date: 21 Gy
Reference Point Session Dosage Given: 3.5 Gy
Session Number: 6

## 2021-12-13 LAB — CBC
HCT: 38 % (ref 36.0–46.0)
Hemoglobin: 12.2 g/dL (ref 12.0–15.0)
MCH: 27.7 pg (ref 26.0–34.0)
MCHC: 32.1 g/dL (ref 30.0–36.0)
MCV: 86.2 fL (ref 80.0–100.0)
Platelets: 370 10*3/uL (ref 150–400)
RBC: 4.41 MIL/uL (ref 3.87–5.11)
RDW: 14.2 % (ref 11.5–15.5)
WBC: 7.3 10*3/uL (ref 4.0–10.5)
nRBC: 0 % (ref 0.0–0.2)

## 2021-12-14 ENCOUNTER — Ambulatory Visit
Admission: RE | Admit: 2021-12-14 | Discharge: 2021-12-14 | Disposition: A | Discharge status: Home / Self Care | Payer: BC Managed Care – PPO | Source: Ambulatory Visit | Attending: Radiation Oncology | Admitting: Radiation Oncology

## 2021-12-14 ENCOUNTER — Other Ambulatory Visit: Payer: Self-pay

## 2021-12-14 DIAGNOSIS — D0512 Intraductal carcinoma in situ of left breast: Secondary | ICD-10-CM | POA: Diagnosis not present

## 2021-12-14 LAB — RAD ONC ARIA SESSION SUMMARY
Course Elapsed Days: 8
Plan Fractions Treated to Date: 7
Plan Prescribed Dose Per Fraction: 3.5 Gy
Plan Total Fractions Prescribed: 10
Plan Total Prescribed Dose: 35 Gy
Reference Point Dosage Given to Date: 24.5 Gy
Reference Point Session Dosage Given: 3.5 Gy
Session Number: 7

## 2021-12-19 ENCOUNTER — Ambulatory Visit: Payer: BC Managed Care – PPO

## 2021-12-20 ENCOUNTER — Ambulatory Visit
Admission: RE | Admit: 2021-12-20 | Discharge: 2021-12-20 | Disposition: A | Discharge status: Home / Self Care | Payer: BC Managed Care – PPO | Source: Ambulatory Visit | Attending: Radiation Oncology | Admitting: Radiation Oncology

## 2021-12-20 ENCOUNTER — Other Ambulatory Visit: Payer: Self-pay

## 2021-12-20 DIAGNOSIS — D0512 Intraductal carcinoma in situ of left breast: Secondary | ICD-10-CM | POA: Diagnosis not present

## 2021-12-20 LAB — RAD ONC ARIA SESSION SUMMARY
Course Elapsed Days: 14
Plan Fractions Treated to Date: 8
Plan Prescribed Dose Per Fraction: 3.5 Gy
Plan Total Fractions Prescribed: 10
Plan Total Prescribed Dose: 35 Gy
Reference Point Dosage Given to Date: 28 Gy
Reference Point Session Dosage Given: 3.5 Gy
Session Number: 8

## 2021-12-21 ENCOUNTER — Ambulatory Visit: Payer: BC Managed Care – PPO

## 2021-12-21 ENCOUNTER — Other Ambulatory Visit: Payer: Self-pay

## 2021-12-21 ENCOUNTER — Ambulatory Visit
Admission: RE | Admit: 2021-12-21 | Discharge: 2021-12-21 | Disposition: A | Discharge status: Home / Self Care | Payer: BC Managed Care – PPO | Source: Ambulatory Visit | Attending: Radiation Oncology | Admitting: Radiation Oncology

## 2021-12-21 DIAGNOSIS — D0512 Intraductal carcinoma in situ of left breast: Secondary | ICD-10-CM | POA: Diagnosis not present

## 2021-12-21 LAB — RAD ONC ARIA SESSION SUMMARY
Course Elapsed Days: 15
Plan Fractions Treated to Date: 9
Plan Prescribed Dose Per Fraction: 3.5 Gy
Plan Total Fractions Prescribed: 10
Plan Total Prescribed Dose: 35 Gy
Reference Point Dosage Given to Date: 31.5 Gy
Reference Point Session Dosage Given: 3.5 Gy
Session Number: 9

## 2021-12-22 ENCOUNTER — Ambulatory Visit
Admission: RE | Admit: 2021-12-22 | Discharge: 2021-12-22 | Disposition: A | Discharge status: Home / Self Care | Payer: BC Managed Care – PPO | Source: Ambulatory Visit | Attending: Radiation Oncology | Admitting: Radiation Oncology

## 2021-12-22 ENCOUNTER — Other Ambulatory Visit: Payer: Self-pay

## 2021-12-22 ENCOUNTER — Ambulatory Visit: Payer: BC Managed Care – PPO | Admitting: Oncology

## 2021-12-22 DIAGNOSIS — D0512 Intraductal carcinoma in situ of left breast: Secondary | ICD-10-CM | POA: Diagnosis not present

## 2021-12-22 LAB — RAD ONC ARIA SESSION SUMMARY
Course Elapsed Days: 16
Plan Fractions Treated to Date: 10
Plan Prescribed Dose Per Fraction: 3.5 Gy
Plan Total Fractions Prescribed: 10
Plan Total Prescribed Dose: 35 Gy
Reference Point Dosage Given to Date: 35 Gy
Reference Point Session Dosage Given: 3.5 Gy
Session Number: 10

## 2021-12-23 ENCOUNTER — Encounter: Payer: Self-pay | Admitting: *Deleted

## 2021-12-23 ENCOUNTER — Ambulatory Visit: Payer: BC Managed Care – PPO | Admitting: Oncology

## 2021-12-27 ENCOUNTER — Inpatient Hospital Stay: Payer: BC Managed Care – PPO | Attending: Oncology | Admitting: Oncology

## 2021-12-27 ENCOUNTER — Encounter: Payer: Self-pay | Admitting: Oncology

## 2021-12-27 VITALS — BP 107/64 | HR 80 | Temp 99.5°F | Resp 18 | Ht 64.0 in | Wt 243.0 lb

## 2021-12-27 DIAGNOSIS — Z87891 Personal history of nicotine dependence: Secondary | ICD-10-CM | POA: Diagnosis not present

## 2021-12-27 DIAGNOSIS — Z923 Personal history of irradiation: Secondary | ICD-10-CM | POA: Insufficient documentation

## 2021-12-27 DIAGNOSIS — Z79899 Other long term (current) drug therapy: Secondary | ICD-10-CM | POA: Diagnosis not present

## 2021-12-27 DIAGNOSIS — Z79810 Long term (current) use of selective estrogen receptor modulators (SERMs): Secondary | ICD-10-CM | POA: Diagnosis not present

## 2021-12-27 DIAGNOSIS — I10 Essential (primary) hypertension: Secondary | ICD-10-CM | POA: Diagnosis not present

## 2021-12-27 DIAGNOSIS — D0512 Intraductal carcinoma in situ of left breast: Secondary | ICD-10-CM | POA: Diagnosis present

## 2021-12-27 MED ORDER — TAMOXIFEN CITRATE 20 MG PO TABS: 20.0000 mg | ORAL_TABLET | Freq: Every day | ORAL | 3 refills | Status: DC

## 2021-12-27 NOTE — Progress Notes (Signed)
[place]  Telephone:(336) [phone] Fax:(336) [phone]  ID: Lenard Galloway OB: 1955/08/11  MR#: [id number]  OFH#:[id number]  Patient Care Team: [facility], [place] as PCP - General Laurance Flatten, Nelwyn Salisbury, RN as Oncology Nurse Navigator  CHIEF COMPLAINT: DCIS left breast  INTERVAL HISTORY: Patient returns to clinic today for further evaluation, discussion of her final pathology results, and treatment planning.  She underwent lumpectomy on November 02, 2021 and tolerated the procedure well.  There was no residual DCIS noted in pathology specimen.  She currently feels well and is asymptomatic.  She has no neurologic complaints.  She denies any recent fevers or illnesses.  She has a good appetite and denies weight loss.  She has no chest pain, shortness of breath, cough, or chills.  She denies any nausea, vomiting, constipation, or diarrhea.  She has no urinary complaints.  Patient offers no specific complaints today.  REVIEW OF SYSTEMS:   Review of Systems  Constitutional: Negative.  Negative for fever, malaise/fatigue and weight loss.  Respiratory: Negative.  Negative for cough, hemoptysis and shortness of breath.   Cardiovascular: Negative.  Negative for chest pain and leg swelling.  Gastrointestinal: Negative.  Negative for abdominal pain.  Genitourinary:  Positive for hematuria. Negative for dysuria.  Musculoskeletal: Negative.  Negative for back pain.  Skin: Negative.  Negative for rash.  Neurological: Negative.  Negative for dizziness, focal weakness, weakness and headaches.  Psychiatric/Behavioral: Negative.  The patient is not nervous/anxious.     As per HPI. Otherwise, a complete review of systems is negative.  PAST MEDICAL HISTORY: Past Medical History:  Diagnosis Date  . Ductal carcinoma in situ (DCIS) of left breast 10/25/2021  . Hypertension     PAST SURGICAL HISTORY: Past Surgical History:  Procedure Laterality Date  . BREAST BIOPSY Left  10/19/2021   Left Breast stereo bx, X clip path pending  . COLONOSCOPY      FAMILY HISTORY: Family History  Problem Relation Age of Onset  . Breast cancer Neg Hx     ADVANCED DIRECTIVES (Y/N):  N  HEALTH MAINTENANCE: Social History   Tobacco Use  . Smoking status: Former    Types: Cigarettes  . Smokeless tobacco: Never  Vaping Use  . Vaping Use: Never used  Substance Use Topics  . Alcohol use: Yes    Comment: social  . Drug use: Not Currently     Colonoscopy:  PAP:  Bone density:  Lipid panel:  No Known Allergies  Current Outpatient Medications  Medication Sig Dispense Refill  . amLODipine (NORVASC) 10 MG tablet Take 10 mg by mouth daily.    Marland Kitchen losartan (COZAAR) 25 MG tablet Take 25 mg by mouth daily.    . Multiple Vitamin (MULTIVITAMIN) tablet Take 2 tablets by mouth daily. Beet Gummies    . Multiple Vitamins-Minerals (DAILY COMBO MULTI VITAMINS PO) Take 2 capsules by mouth 1 day or 1 dose.     No current facility-administered medications for this visit.    OBJECTIVE: Vitals:   12/27/21 1514  BP: 107/64  Pulse: 80  Resp: 18  Temp: 99.5 F (37.5 C)  SpO2: 100%     Body mass index is 41.71 kg/m.    ECOG FS:0 - Asymptomatic  General: Well-developed, well-nourished, no acute distress. Eyes: Pink conjunctiva, anicteric sclera. HEENT: Normocephalic, moist mucous membranes. Breast: Exam deferred today. Lungs: No audible wheezing or coughing. Heart: Regular rate and rhythm. Abdomen: Soft, nontender, no obvious distention. Musculoskeletal: No edema, cyanosis, or clubbing. Neuro:  Alert, answering all questions appropriately. Cranial nerves grossly intact. Skin: No rashes or petechiae noted. Psych: Normal affect.  LAB RESULTS:  No results found for: "NA", "K", "CL", "CO2", "GLUCOSE", "BUN", "CREATININE", "CALCIUM", "PROT", "ALBUMIN", "AST", "ALT", "ALKPHOS", "BILITOT", "GFRNONAA", "GFRAA"  Lab Results  Component Value Date   WBC 7.3 12/13/2021   HGB  12.2 12/13/2021   HCT 38.0 12/13/2021   MCV 86.2 12/13/2021   PLT 370 12/13/2021     STUDIES: No results found.  ASSESSMENT: DCIS left breast.  PLAN:    DCIS left breast: Pathology and imaging reviewed independently.  Lumpectomy on November 02, 2020 did not reveal any residual DCIS.  Given the noninvasive nature of the lesion, she does not require adjuvant chemotherapy.  Patient has an appointment with radiation oncology today for consideration of adjuvant XRT.  She will benefit from 5 years of tamoxifen until inclusion of her treatments.  Follow-up at the end of radiation for further evaluation and initiation of treatment.    I spent a total of 30 minutes reviewing chart data, face-to-face evaluation with the patient, counseling and coordination of care as detailed above.   Patient expressed understanding and was in agreement with this plan. She also understands that She can call clinic at any time with any questions, concerns, or complaints.    Cancer Staging  Ductal carcinoma in situ (DCIS) of left breast Staging form: Breast, AJCC 8th Edition - Clinical stage from 10/28/2021: Stage 0 (cTis (DCIS), cN0, cM0, ER+, PR: Not Assessed, HER2: Not Assessed) - Signed by Lloyd Huger, MD on 10/28/2021 Stage prefix: Initial diagnosis   Lloyd Huger, MD   12/27/2021 3:28 PM

## 2021-12-28 ENCOUNTER — Encounter: Payer: Self-pay | Admitting: *Deleted

## 2022-01-30 ENCOUNTER — Ambulatory Visit: Payer: Medicare PPO | Admitting: Radiation Oncology

## 2022-02-02 ENCOUNTER — Ambulatory Visit
Admission: RE | Admit: 2022-02-02 | Discharge: 2022-02-02 | Disposition: A | Discharge status: Home / Self Care | Payer: Medicare PPO | Source: Ambulatory Visit | Attending: Radiation Oncology | Admitting: Radiation Oncology

## 2022-02-02 ENCOUNTER — Encounter: Payer: Self-pay | Admitting: Radiation Oncology

## 2022-02-02 DIAGNOSIS — D0512 Intraductal carcinoma in situ of left breast: Secondary | ICD-10-CM | POA: Insufficient documentation

## 2022-02-02 NOTE — Progress Notes (Signed)
Radiation Oncology Follow up Note  Name: Chloe Jones   Date:   02/02/2022 MRN:  [id number] DOB: 1955-11-12    This 67 y.o. female presents to the clinic today for 1 month follow-up status post whole breast radiation to her left breast for ER positive ductal carcinoma in situ.  REFERRING PROVIDER: [facility], [employer]*  HPI: Patient is a 68 year old female now out 1 month having completed whole breast radiation to her left breast for stage 0 (Tis N0 M0) ER positive ductal carcinoma in situ.  Seen today in routine follow-up she is doing well.  She specifically denies breast tenderness cough or bone pain..  She has been started on tamoxifen tolerating it well without side effect  COMPLICATIONS OF TREATMENT: none  FOLLOW UP COMPLIANCE: keeps appointments   PHYSICAL EXAM:  There were no vitals taken for this visit. Lungs are clear to A&P cardiac examination essentially unremarkable with regular rate and rhythm. No dominant mass or nodularity is noted in either breast in 2 positions examined. Incision is well-healed. No axillary or supraclavicular adenopathy is appreciated. Cosmetic result is excellent.  Well-developed well-nourished patient in NAD. HEENT reveals PERLA, EOMI, discs not visualized.  Oral cavity is clear. No oral mucosal lesions are identified. Neck is clear without evidence of cervical or supraclavicular adenopathy. Lungs are clear to A&P. Cardiac examination is essentially unremarkable with regular rate and rhythm without murmur rub or thrill. Abdomen is benign with no organomegaly or masses noted. Motor sensory and DTR levels are equal and symmetric in the upper and lower extremities. Cranial nerves II through XII are grossly intact. Proprioception is intact. No peripheral adenopathy or edema is identified. No motor or sensory levels are noted. Crude visual fields are within normal range.  RADIOLOGY RESULTS: No current films for review  PLAN: Present time patient is doing  well 1 month out from whole breast radiation and pleased with her overall progress.  Of asked to see her back in 6 months for follow-up.  She continues on tamoxifen without side effect.  Patient is to call with any concerns.  I would like to take this opportunity to thank you for allowing me to participate in the care of your patient.Noreene Filbert, MD

## 2022-03-14 ENCOUNTER — Other Ambulatory Visit: Payer: Self-pay | Admitting: Family Medicine

## 2022-03-14 DIAGNOSIS — Z780 Asymptomatic menopausal state: Secondary | ICD-10-CM

## 2022-03-16 ENCOUNTER — Ambulatory Visit
Admission: RE | Admit: 2022-03-16 | Discharge: 2022-03-16 | Disposition: A | Discharge status: Home / Self Care | Payer: Medicare PPO | Source: Ambulatory Visit | Attending: Family Medicine | Admitting: Family Medicine

## 2022-03-16 DIAGNOSIS — Z780 Asymptomatic menopausal state: Secondary | ICD-10-CM | POA: Diagnosis present

## 2022-03-30 ENCOUNTER — Encounter: Payer: Self-pay | Admitting: Oncology

## 2022-03-30 ENCOUNTER — Inpatient Hospital Stay: Payer: Medicare PPO | Attending: Oncology | Admitting: Oncology

## 2022-03-30 VITALS — BP 119/71 | HR 70 | Temp 98.6°F | Resp 18 | Ht 64.0 in | Wt 244.0 lb

## 2022-03-30 DIAGNOSIS — D0512 Intraductal carcinoma in situ of left breast: Secondary | ICD-10-CM | POA: Diagnosis not present

## 2022-03-30 DIAGNOSIS — Z79810 Long term (current) use of selective estrogen receptor modulators (SERMs): Secondary | ICD-10-CM | POA: Insufficient documentation

## 2022-03-30 DIAGNOSIS — Z79899 Other long term (current) drug therapy: Secondary | ICD-10-CM | POA: Insufficient documentation

## 2022-03-30 DIAGNOSIS — Z87891 Personal history of nicotine dependence: Secondary | ICD-10-CM | POA: Diagnosis not present

## 2022-03-30 DIAGNOSIS — I10 Essential (primary) hypertension: Secondary | ICD-10-CM | POA: Insufficient documentation

## 2022-03-30 DIAGNOSIS — Z923 Personal history of irradiation: Secondary | ICD-10-CM | POA: Insufficient documentation

## 2022-03-30 NOTE — Progress Notes (Signed)
[name]  [phone]) [phone] Fax:(336) [phone]  ID: Chloe Jones OB: 11-Jul-1955  MR#: [id number]  QN:[id number]  Patient Care Team: [facility], [place] as PCP - Chloe Jones, Chloe Salisbury, RN as Oncology Nurse Navigator  CHIEF COMPLAINT: DCIS left breast  INTERVAL HISTORY: Patient returns to clinic today for routine 33-monthevaluation.  She has occasional hot flashes with her tamoxifen, but these are not disruptive nor affect her day-to-day activity.  She currently feels well and is asymptomatic.  She has no neurologic complaints.  She denies any recent fevers or illnesses.  She has a good appetite and denies weight loss.  She has no chest pain, shortness of breath, cough, or chills.  She denies any nausea, vomiting, constipation, or diarrhea.  She has no urinary complaints.  Patient offers no further specific complaints today.  REVIEW OF SYSTEMS:   Review of Systems  Constitutional: Negative.  Negative for fever, malaise/fatigue and weight loss.  Respiratory: Negative.  Negative for cough, hemoptysis and shortness of breath.   Cardiovascular: Negative.  Negative for chest pain and leg swelling.  Gastrointestinal: Negative.  Negative for abdominal pain.  Genitourinary: Negative.  Negative for dysuria.  Musculoskeletal: Negative.  Negative for back pain.  Skin: Negative.  Negative for rash.  Neurological:  Positive for sensory change. Negative for dizziness, focal weakness, weakness and headaches.  Psychiatric/Behavioral: Negative.  The patient is not nervous/anxious.     As per HPI. Otherwise, a complete review of systems is negative.  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   Hypertension     PAST SURGICAL HISTORY: Past Surgical History:  Procedure Laterality Date   BREAST BIOPSY Left 10/19/2021   Left Breast stereo bx, X clip path pending   COLONOSCOPY       FAMILY HISTORY: Family History  Problem Relation Age of Onset   Breast cancer Neg Hx     ADVANCED DIRECTIVES (Y/N):  N  HEALTH MAINTENANCE: Social History   Tobacco Use   Smoking status: Former    Types: Cigarettes   Smokeless tobacco: Never  Vaping Use   Vaping Use: Never used  Substance Use Topics   Alcohol use: Yes    Comment: social   Drug use: Not Currently     Colonoscopy:  PAP:  Bone density:  Lipid panel:  No Known Allergies  Current Outpatient Medications  Medication Sig Dispense Refill   amLODipine (NORVASC) 10 MG tablet Take 10 mg by mouth daily.     losartan (COZAAR) 25 MG tablet Take 25 mg by mouth daily.     Multiple Vitamin (MULTIVITAMIN) tablet Take 2 tablets by mouth daily. Beet Gummies     Multiple Vitamins-Minerals (DAILY COMBO MULTI VITAMINS PO) Take 2 capsules by mouth 1 day or 1 dose.     tamoxifen (NOLVADEX) 20 MG tablet Take 1 tablet (20 mg total) by mouth daily. 90 tablet 3   No current facility-administered medications for this visit.    OBJECTIVE: Vitals:   03/30/22 1504  BP: 119/71  Pulse: 70  Resp: 18  Temp: 98.6 F (37 C)  SpO2: 100%     Body mass index is 41.88 kg/m.    ECOG FS:0 - Asymptomatic  General: Well-developed, well-nourished, no acute distress. Eyes: Pink conjunctiva, anicteric sclera. HEENT: Normocephalic, moist mucous membranes. Breast: Exam deferred today. Lungs: No audible wheezing or coughing. Heart: Regular rate and rhythm. Abdomen: Soft, nontender, no  obvious distention. Musculoskeletal: No edema, cyanosis, or clubbing. Neuro: Alert, answering all questions appropriately. Cranial nerves grossly intact. Skin: No rashes or petechiae noted. Psych: Normal affect.  LAB RESULTS:  No results found for: "NA", "K", "CL", "CO2", "GLUCOSE", "BUN", "CREATININE", "CALCIUM", "PROT", "ALBUMIN", "AST", "ALT", "ALKPHOS", "BILITOT", "GFRNONAA", "GFRAA"  Lab Results  Component Value Date   WBC 7.3 12/13/2021    HGB 12.2 12/13/2021   HCT 38.0 12/13/2021   MCV 86.2 12/13/2021   PLT 370 12/13/2021     STUDIES: DG BONE DENSITY (DXA)  Result Date: 03/16/2022 EXAM: DUAL X-RAY ABSORPTIOMETRY (DXA) FOR BONE MINERAL DENSITY IMPRESSION: Your patient Chloe Jones completed a BMD test on 03/16/2022 using the [place] (software version: 14.10) manufactured by [employer]. The following summarizes the results of our evaluation. Technologist: MTB PATIENT BIOGRAPHICAL: Name: Chloe, Jones Patient ID: [id number] Birth Date: 08/06/1955 Height: 64.0 in. Gender: Female Exam Date: 03/16/2022 Weight: 243.0 lbs. Indications: Breast CA, Postmenopausal Fractures: Treatments: DENSITOMETRY RESULTS: Site          Region     Measured Date Measured Age WHO Classification Young Adult T-score BMD         %Change vs. Previous Significant Change (*) AP Spine L1-L4 03/16/2022 65.1 Normal 2.1 1.454 g/cm2 - - DualFemur Neck Left 03/16/2022 65.1 Normal -0.3 1.003 g/cm2 - - DualFemur Total Mean 03/16/2022 65.1 Normal 0.5 1.069 g/cm2 - - Right Forearm Radius 33% 03/16/2022 65.1 Normal -0.2 0.859 g/cm2 - - ASSESSMENT: The BMD measured at Femur Neck Left is 1.003 g/cm2 with a T-score of -0.3. This patient is considered normal according to [facility]) criteria. The scan quality is good. World Pharmacologist [facility]) criteria for post-menopausal, Caucasian Women: Normal:                   T-score at or above -1 SD Osteopenia/low bone mass: T-score between -1 and -2.5 SD Osteoporosis:             T-score at or below -2.5 SD RECOMMENDATIONS: 1. All patients should optimize calcium and vitamin D intake. 2. Consider FDA-approved medical therapies in postmenopausal women and men aged 49 years and older, based on the following: a. A hip or vertebral(clinical or morphometric) fracture b. T-score < -2.5 at the femoral neck or spine after appropriate evaluation to exclude secondary causes c. Low bone mass (T-score between -1.0 and  -2.5 at the femoral neck or spine) and a 10-year probability of a hip fracture > 3% or a 10-year probability of a major osteoporosis-related fracture > 20% based on the [place]-adapted WHO algorithm 3. Clinician judgment and/or patient preferences may indicate treatment for people with 10-year fracture probabilities above or below these levels FOLLOW-UP: People with diagnosed cases of osteoporosis or at high risk for fracture should have regular bone mineral density tests. For patients eligible for Medicare, routine testing is allowed once every 2 years. The testing frequency can be increased to one year for patients who have rapidly progressing disease, those who are receiving or discontinuing medical therapy to restore bone mass, or have additional risk factors. I have reviewed this report, and agree with the above findings. [facility] Electronically Signed   By: Ammie Ferrier M.D.   On: 03/16/2022 15:18    ASSESSMENT: DCIS left breast.  PLAN:    DCIS left breast: Pathology and imaging reviewed independently.  Lumpectomy on November 02, 2020 did not reveal any residual DCIS.  Given the noninvasive  nature of the lesion, she did not require adjuvant chemotherapy.  Patient completed adjuvant XRT.  Continue tamoxifen for a total of 5 years completing treatment in December 2028.  Patient will have video-assisted telemedicine visit in 6 months for further evaluation.   Bone health: Bone mineral density on March 16, 2022 revealed a T-score of -0.3 which is normal.  I spent a total of 20 minutes reviewing chart data, face-to-face evaluation with the patient, counseling and coordination of care as detailed above.   Patient expressed understanding and was in agreement with this plan. She also understands that She can call clinic at any time with any questions, concerns, or complaints.    Cancer Staging  Ductal carcinoma in situ (DCIS) of left breast Staging form: Breast, AJCC 8th Edition -  Clinical stage from 10/28/2021: Stage 0 (cTis (DCIS), cN0, cM0, ER+, PR: Not Assessed, HER2: Not Assessed) - Signed by Lloyd Huger, MD on 10/28/2021 Stage prefix: Initial diagnosis   Lloyd Huger, MD   03/30/2022 3:42 PM

## 2022-04-03 ENCOUNTER — Other Ambulatory Visit: Payer: Self-pay | Admitting: Family Medicine

## 2022-04-03 DIAGNOSIS — I10 Essential (primary) hypertension: Secondary | ICD-10-CM

## 2022-04-03 DIAGNOSIS — Z780 Asymptomatic menopausal state: Secondary | ICD-10-CM

## 2022-05-17 IMAGING — US US RENAL ARTERY STENOSIS
1 series · 14 of 25 positions shown · non-contrast
Comparison: None.

CLINICAL DATA: 66-year-old female with possible diminished blood
flow

EXAM:
RENAL/URINARY TRACT ULTRASOUND
RENAL DUPLEX DOPPLER ULTRASOUND

[Series 1: us renal artery duplex complete · 14 of 85 slices shown]
[im 1/85]
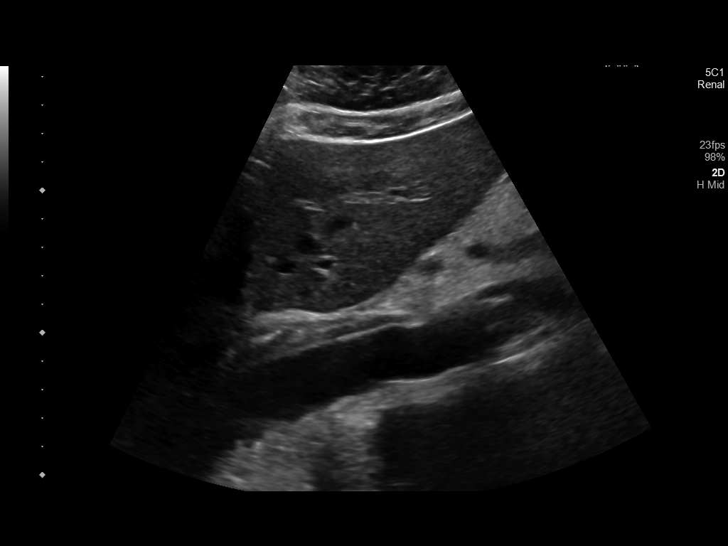
[im 8/85]
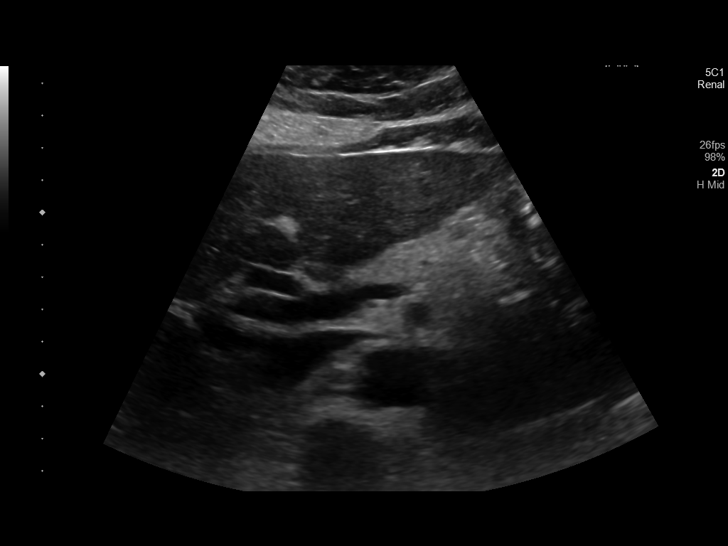
[im 15/85]
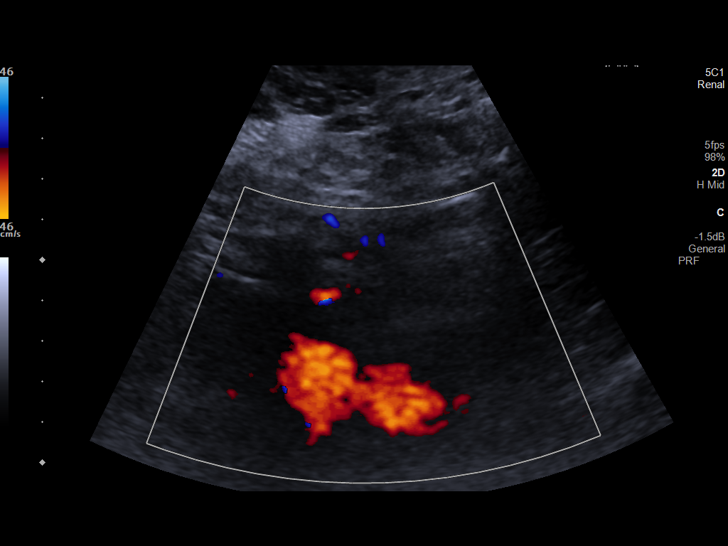
[im 22/85]
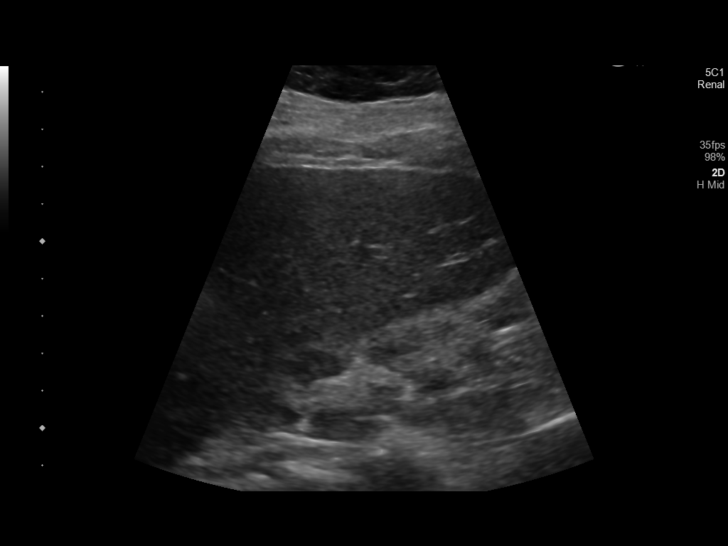
[im 29/85]
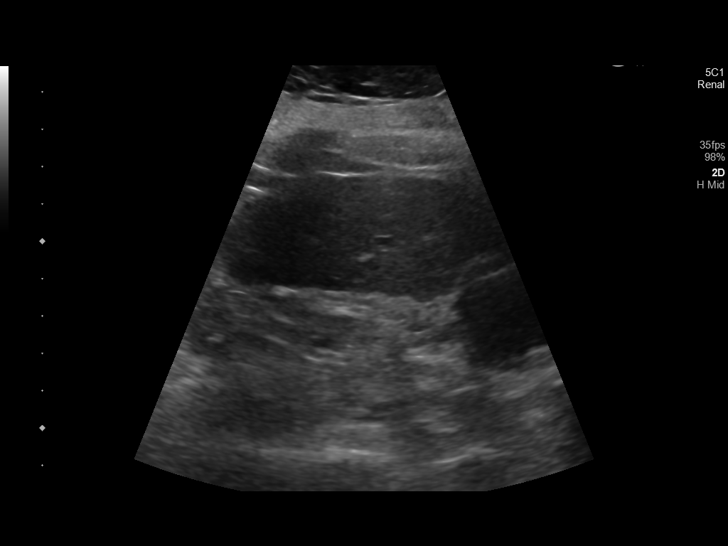
[im 32/85]
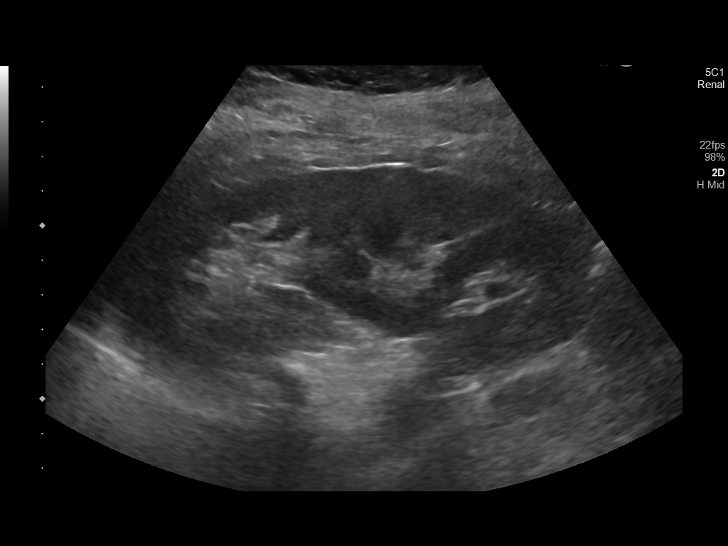
[im 39/85]
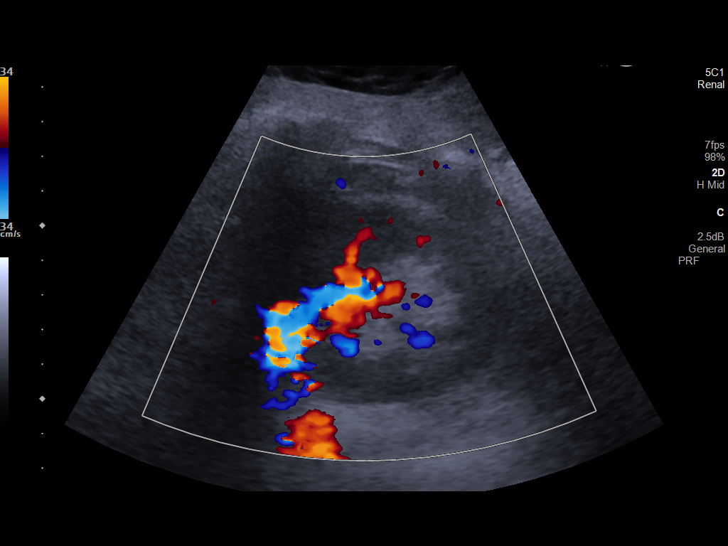
[im 46/85]
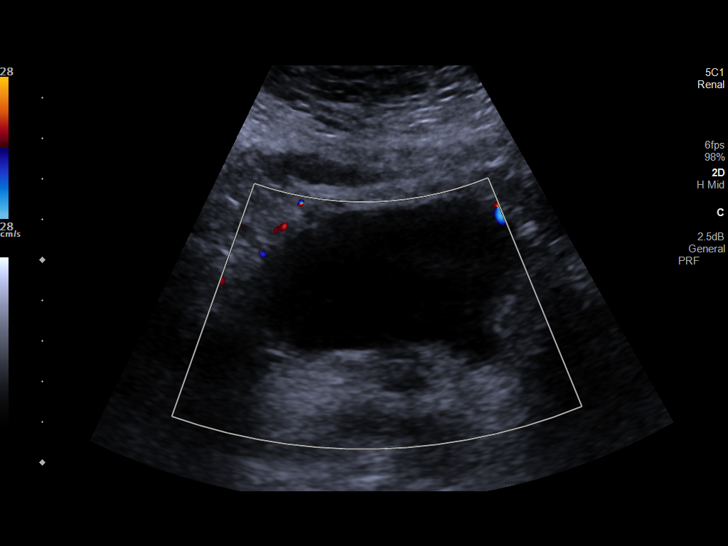
[im 53/85]
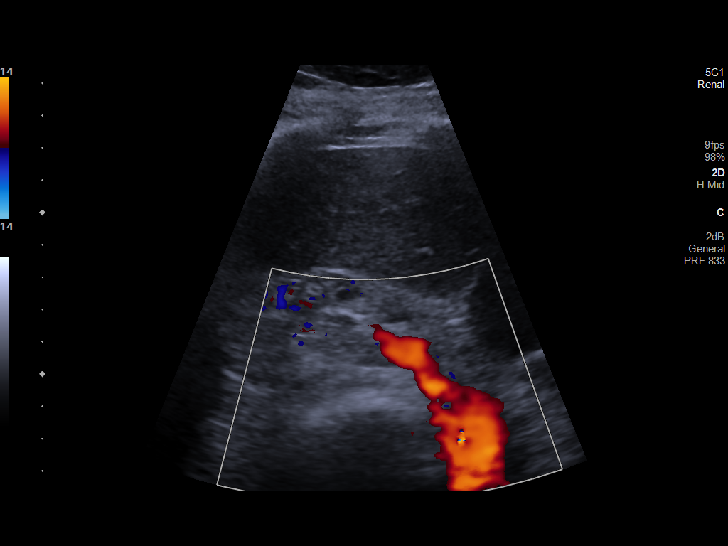
[im 57/85]
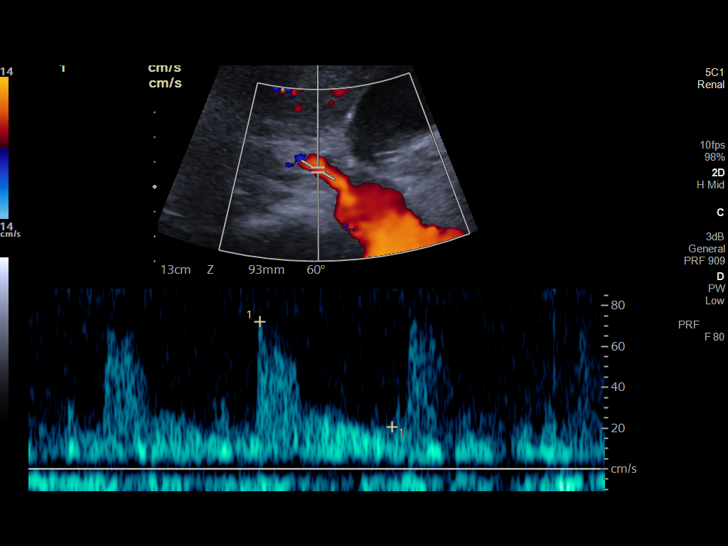
[im 64/85]
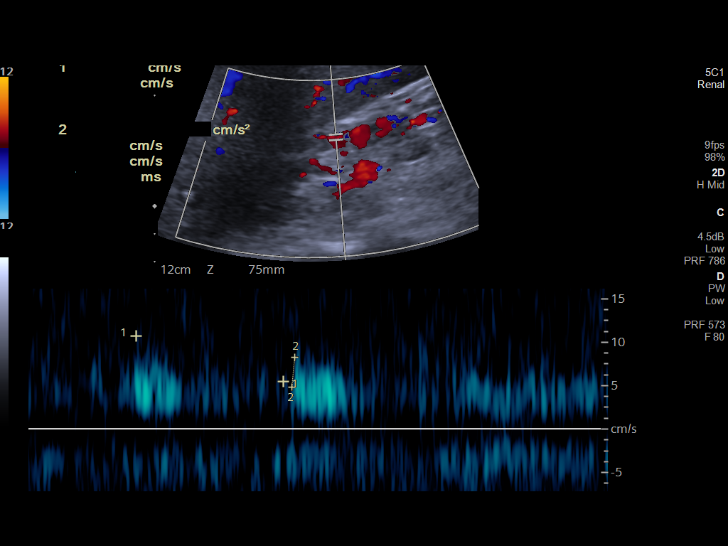
[im 71/85]
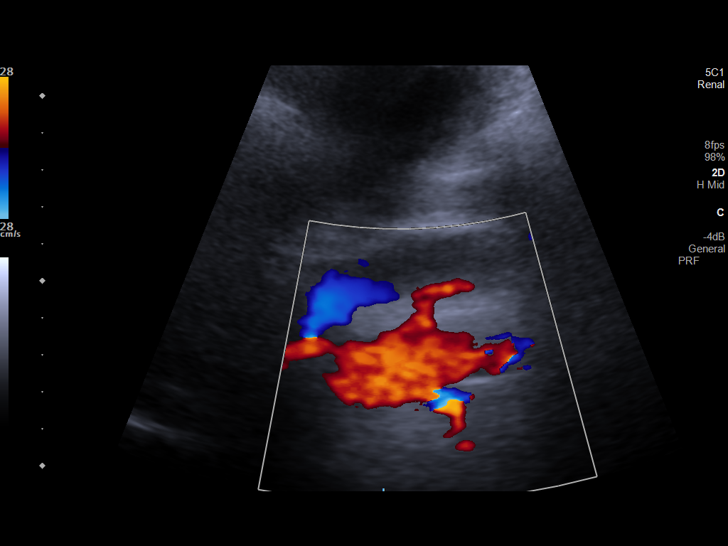
[im 78/85]
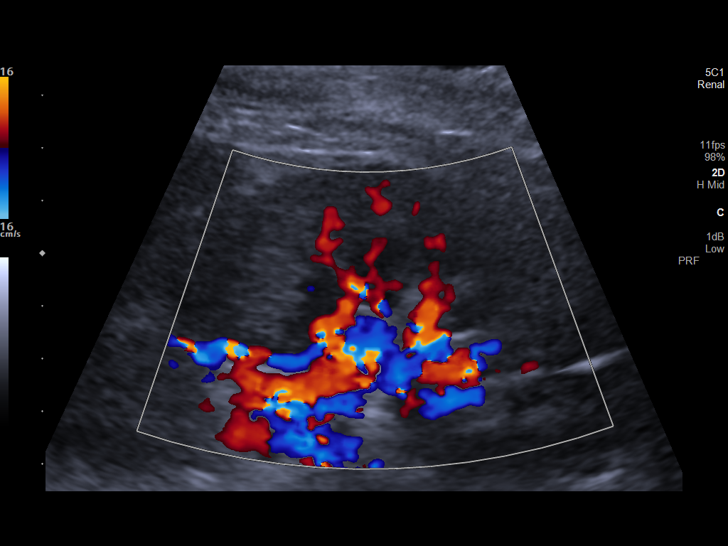
[im 85/85]
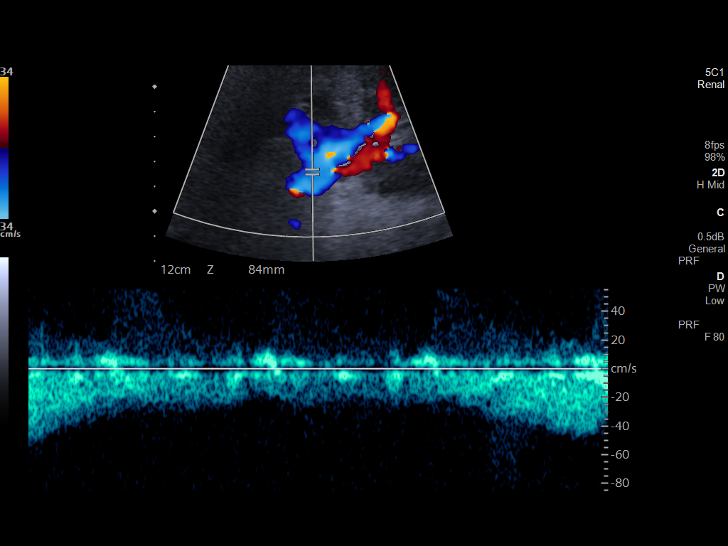

[14 of 25 positions shown; findings below may reference images not displayed]

FINDINGS: Right Kidney:

Length: 7.0 cm x 2.5 cm x 3.5 cm, 34 cc. Asymmetric atrophy of the
right kidney with hyperechoic cortex. No hydronephrosis. Flow in the
hilum.

Left Kidney:

Length: 12.3 cm x 5.1 cm x 6.2 cm, 218 cc. Unremarkable appearance
of the cortex. No hydronephrosis.

Bladder:  Un remark

RENAL DUPLEX ULTRASOUND

Right Renal Artery Velocities:

Origin:  54 cm/sec

Mid:  72 cm/sec

Hilum:  112 cm/sec

Interlobar:  20 cm/sec

Arcuate:  15 cm/sec

Left Renal Artery Velocities:

Origin:  89 cm/sec

Mid:  169 cm/sec

Hilum:  95 cm/sec

Interlobar:  58 cm/sec

Arcuate:  22 cm/sec

Aortic Velocity:  95 cm/sec

Right Renal-Aortic Ratios:

Origin:

Mid:

Hilum:

Interlobar:

Arcuate:

Left Renal-Aortic Ratios:

Origin:

Mid:

Hilum:

Interlobar:

Arcuate:
IMPRESSION: Decreased size of the right kidney measuring 7 cm, with echogenic
cortex suggesting medical renal disease.

Directed duplex of the renal arteries demonstrates no evidence of
high-grade stenosis.

## 2022-08-10 ENCOUNTER — Ambulatory Visit
Admission: RE | Admit: 2022-08-10 | Discharge: 2022-08-10 | Disposition: A | Discharge status: Home / Self Care | Payer: Medicare PPO | Source: Ambulatory Visit | Attending: Radiation Oncology | Admitting: Radiation Oncology

## 2022-08-10 VITALS — BP 121/71 | HR 86 | Temp 86.0°F | Ht 64.0 in | Wt 249.6 lb

## 2022-08-10 DIAGNOSIS — D0512 Intraductal carcinoma in situ of left breast: Secondary | ICD-10-CM

## 2022-08-10 DIAGNOSIS — Z923 Personal history of irradiation: Secondary | ICD-10-CM | POA: Insufficient documentation

## 2022-08-10 DIAGNOSIS — Z79810 Long term (current) use of selective estrogen receptor modulators (SERMs): Secondary | ICD-10-CM | POA: Insufficient documentation

## 2022-08-10 DIAGNOSIS — Z170 Estrogen receptor positive status [ER+]: Secondary | ICD-10-CM | POA: Insufficient documentation

## 2022-08-10 NOTE — Progress Notes (Signed)
Radiation Oncology Follow up Note  Name: Chloe Jones   Date:   08/10/2022 MRN:  [id number] DOB: May 16, 1955    This 67 y.o. 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  REFERRING PROVIDER: Center, [employer]  HPI: Patient is a 67 year old female now out 7 months having completed whole breast radiation to her left breast for ductal carcinoma in situ seen today in routine follow-up she is doing well occasional some tingling around the lumpectomy site which is expected.  Otherwise is without complaint..  She has been started on tamoxifen tolerating that well.  COMPLICATIONS OF TREATMENT: none  FOLLOW UP COMPLIANCE: keeps appointments   PHYSICAL EXAM:  BP 121/71   Pulse 86   Temp (!) 86 F (30 C)   Ht 5\' 4"  (1.626 m)   Wt 249 lb 9.6 oz (113.2 kg)   BMI 42.84 kg/m  Lungs are clear to A&P cardiac examination essentially unremarkable with regular rate and rhythm. No dominant mass or nodularity is noted in either breast in 2 positions examined. Incision is well-healed. No axillary or supraclavicular adenopathy is appreciated. Cosmetic result is excellent.  Well-developed well-nourished patient in NAD. HEENT reveals PERLA, EOMI, discs not visualized.  Oral cavity is clear. No oral mucosal lesions are identified. Neck is clear without evidence of cervical or supraclavicular adenopathy. Lungs are clear to A&P. Cardiac examination is essentially unremarkable with regular rate and rhythm without murmur rub or thrill. Abdomen is benign with no organomegaly or masses noted. Motor sensory and DTR levels are equal and symmetric in the upper and lower extremities. Cranial nerves II through XII are grossly intact. Proprioception is intact. No peripheral adenopathy or edema is identified. No motor or sensory levels are noted. Crude visual fields are within normal range.  RADIOLOGY RESULTS:  No current films for review  PLAN: As in time patient is doing well 7 months out from whole breast radiation and pleased with her overall progress.  Of asked to see her back in 6 months for follow-up and then we will start once year follow-up visits.  She continues on tamoxifen without side effect.  Patient is to call with any concerns.  I would like to take this opportunity to thank you for allowing me to participate in the care of your patient.Carmina Miller, MD

## 2022-08-10 NOTE — Progress Notes (Signed)
Survivorship Care Plan visit completed.  Treatment summary reviewed and given to patient.  ASCO answers booklet reviewed and given to patient.  CARE program and Cancer Transitions discussed with patient along with other resources cancer center offers to patients and caregivers.  Patient verbalized understanding.    

## 2022-10-03 ENCOUNTER — Inpatient Hospital Stay: Payer: Medicare PPO | Attending: Oncology | Admitting: Oncology

## 2022-10-03 DIAGNOSIS — D0512 Intraductal carcinoma in situ of left breast: Secondary | ICD-10-CM

## 2022-10-03 NOTE — Progress Notes (Signed)
[facility]  Telephone:(336) [phone] Fax:(336) [phone]  ID: Chloe Jones OB: Sep 04, 1955  MR#: [id number]  ION#:[id number]  Patient Care Team: Center, [facility] as PCP - Robbie Louis, Armando Reichert, RN as Oncology Nurse Navigator Orlie Dakin, Tollie Pizza, MD as Consulting Physician (Oncology) Carmina Miller, MD as Consulting Physician (Radiation Oncology) Carolan Shiver, MD as Consulting Physician (General Surgery)  I connected with Chloe Jones on 10/03/22 at  3:30 PM EDT by video enabled telemedicine visit and verified that I am speaking with the correct person using two identifiers.   I discussed the limitations, risks, security and privacy concerns of performing an evaluation and management service by telemedicine and the availability of in-person appointments. I also discussed with the patient that there may be a patient responsible charge related to this service. The patient expressed understanding and agreed to proceed.   Other persons participating in the visit and their role in the encounter: Patient, MD.  Patient's location: Home. Provider's location: Clinic.  CHIEF COMPLAINT: DCIS left breast  INTERVAL HISTORY: Patient agreed to video assisted telemedicine visit for routine 47-month evaluation.  She continues to have occasional hot flashes, but these are not bothersome and do not affect her day-to-day activity.  She otherwise is tolerating tamoxifen well without significant side effects.  She currently feels well and is asymptomatic.  She has no neurologic complaints.  She denies any recent fevers or illnesses.  She has a good appetite and denies weight loss.  She has no chest pain, shortness of breath, cough, or chills.  She denies any nausea, vomiting, constipation, or diarrhea.  She has no urinary complaints.  Patient offers no further specific complaints today.  REVIEW OF SYSTEMS:   Review of Systems  Constitutional: Negative.  Negative  for fever, malaise/fatigue and weight loss.  Respiratory: Negative.  Negative for cough, hemoptysis and shortness of breath.   Cardiovascular: Negative.  Negative for chest pain and leg swelling.  Gastrointestinal: Negative.  Negative for abdominal pain.  Genitourinary: Negative.  Negative for dysuria.  Musculoskeletal: Negative.  Negative for back pain.  Skin: Negative.  Negative for rash.  Neurological:  Positive for sensory change. Negative for dizziness, focal weakness, weakness and headaches.  Psychiatric/Behavioral: Negative.  The patient is not nervous/anxious.     As per HPI. Otherwise, a complete review of systems is negative.  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   Hypertension     PAST SURGICAL HISTORY: Past Surgical History:  Procedure Laterality Date   BREAST BIOPSY Left 10/19/2021   Left Breast stereo bx, X clip path pending   COLONOSCOPY      FAMILY HISTORY: Family History  Problem Relation Age of Onset   Breast cancer Neg Hx     ADVANCED DIRECTIVES (Y/N):  N  HEALTH MAINTENANCE: Social History   Tobacco Use   Smoking status: Former    Types: Cigarettes   Smokeless tobacco: Never  Vaping Use   Vaping status: Never Used  Substance Use Topics   Alcohol use: Yes    Comment: social   Drug use: Not Currently     Colonoscopy:  PAP:  Bone density:  Lipid panel:  No Known Allergies  Current Outpatient Medications  Medication Sig Dispense Refill   amLODipine (NORVASC) 10 MG tablet Take 10 mg by mouth daily.     losartan (COZAAR) 25 MG tablet Take 25 mg by mouth daily.  Multiple Vitamin (MULTIVITAMIN) tablet Take 2 tablets by mouth daily. Beet Gummies     Multiple Vitamins-Minerals (DAILY COMBO MULTI VITAMINS PO) Take 2 capsules by mouth 1 day or 1 dose.     tamoxifen (NOLVADEX) 20 MG tablet Take 1 tablet (20 mg total) by mouth daily. 90 tablet 3   No current  facility-administered medications for this visit.    OBJECTIVE: There were no vitals filed for this visit.    There is no height or weight on file to calculate BMI.    ECOG FS:0 - Asymptomatic  General: Well-developed, well-nourished, no acute distress. HEENT: Normocephalic. Neuro: Alert, answering all questions appropriately. Cranial nerves grossly intact. Psych: Normal affect.  LAB RESULTS:  No results found for: "NA", "K", "CL", "CO2", "GLUCOSE", "BUN", "CREATININE", "CALCIUM", "PROT", "ALBUMIN", "AST", "ALT", "ALKPHOS", "BILITOT", "GFRNONAA", "GFRAA"  Lab Results  Component Value Date   WBC 7.3 12/13/2021   HGB 12.2 12/13/2021   HCT 38.0 12/13/2021   MCV 86.2 12/13/2021   PLT 370 12/13/2021     STUDIES: No results found.  ASSESSMENT: DCIS left breast.  PLAN:    DCIS left breast: Pathology and imaging reviewed independently.  Lumpectomy on November 02, 2020 did not reveal any residual DCIS.  Given the noninvasive nature of the lesion, she did not require adjuvant chemotherapy.  Patient completed adjuvant XRT.  Continue tamoxifen for a total of 5 years completing treatment in December 2028.  Patient will require mammogram in October 2024.  Return to clinic in 6 months with video-assisted telemedicine visit.   Bone health: Bone mineral density on March 16, 2022 revealed a T-score of -0.3 which is normal.  Further bone densities per primary care.  I provided 20 minutes of face-to-face video visit time during this encounter which included chart review, counseling, and coordination of care as documented above.    Patient expressed understanding and was in agreement with this plan. She also understands that She can call clinic at any time with any questions, concerns, or complaints.    Cancer Staging  Ductal carcinoma in situ (DCIS) of left breast Staging form: Breast, AJCC 8th Edition - Clinical stage from 10/28/2021: Stage 0 (cTis (DCIS), cN0, cM0, ER+, PR: Not Assessed,  HER2: Not Assessed) - Signed by Jeralyn Ruths, MD on 10/28/2021 Stage prefix: Initial diagnosis   Jeralyn Ruths, MD   10/03/2022 3:30 PM

## 2022-10-04 NOTE — Addendum Note (Signed)
Addended by: Luz Lex on: 10/04/2022 09:04 AM   Modules accepted: Orders

## 2022-10-04 NOTE — Addendum Note (Signed)
Addended by: Luz Lex on: 10/04/2022 08:51 AM   Modules accepted: Orders

## 2022-11-07 ENCOUNTER — Other Ambulatory Visit: Payer: Medicare PPO

## 2022-11-15 ENCOUNTER — Ambulatory Visit
Admission: RE | Admit: 2022-11-15 | Discharge: 2022-11-15 | Disposition: A | Discharge status: Home / Self Care | Payer: Medicare PPO | Source: Ambulatory Visit | Attending: Oncology | Admitting: Oncology

## 2022-11-15 DIAGNOSIS — D0512 Intraductal carcinoma in situ of left breast: Secondary | ICD-10-CM

## 2023-02-10 ENCOUNTER — Other Ambulatory Visit: Payer: Self-pay | Admitting: Oncology

## 2023-02-15 ENCOUNTER — Encounter: Payer: Self-pay | Admitting: Radiation Oncology

## 2023-02-15 ENCOUNTER — Ambulatory Visit
Admission: RE | Admit: 2023-02-15 | Discharge: 2023-02-15 | Disposition: A | Discharge status: Home / Self Care | Payer: Medicare PPO | Source: Ambulatory Visit | Attending: Radiation Oncology | Admitting: Radiation Oncology

## 2023-02-15 VITALS — BP 121/78 | HR 82 | Temp 98.8°F | Resp 20 | Wt 255.9 lb

## 2023-02-15 DIAGNOSIS — D0512 Intraductal carcinoma in situ of left breast: Secondary | ICD-10-CM | POA: Diagnosis present

## 2023-02-15 DIAGNOSIS — Z923 Personal history of irradiation: Secondary | ICD-10-CM | POA: Insufficient documentation

## 2023-02-15 DIAGNOSIS — Z79810 Long term (current) use of selective estrogen receptor modulators (SERMs): Secondary | ICD-10-CM | POA: Diagnosis not present

## 2023-02-15 DIAGNOSIS — Z170 Estrogen receptor positive status [ER+]: Secondary | ICD-10-CM | POA: Insufficient documentation

## 2023-02-15 NOTE — Progress Notes (Signed)
Radiation Oncology Follow up Note  Name: Chloe Jones   Date:   02/15/2023 MRN:  [id number] DOB: Dec 20, 1955    This 68 y.o. female presents to the clinic today for 37-month follow-up status post whole breast radiation to her left breast for ER positive ductal carcinoma site.  REFERRING PROVIDER: [facility], [employer]  HPI: Patient is a 68 year old female now out 13 months having completed whole breast radiation to her left breast for ductal carcinoma in situ ER positive.  Seen today in routine follow-up she is doing well.  She specifically denies breast tenderness cough or bone pain.  She is currently on.  Tamoxifen tolerating it well without side effect.  She had mammograms back in October which I have reviewed were BI-RADS 2 benign.  COMPLICATIONS OF TREATMENT: none  FOLLOW UP COMPLIANCE: keeps appointments   PHYSICAL EXAM:  BP 121/78   Pulse 82   Temp 98.8 F (37.1 C) (Tympanic)   Resp 20   Wt 255 lb 14.4 oz (116.1 kg)   BMI 43.93 kg/m  Lungs are clear to A&P cardiac examination essentially unremarkable with regular rate and rhythm. No dominant mass or nodularity is noted in either breast in 2 positions examined. Incision is well-healed. No axillary or supraclavicular adenopathy is appreciated. Cosmetic result is excellent.  Well-developed well-nourished patient in NAD. HEENT reveals PERLA, EOMI, discs not visualized.  Oral cavity is clear. No oral mucosal lesions are identified. Neck is clear without evidence of cervical or supraclavicular adenopathy. Lungs are clear to A&P. Cardiac examination is essentially unremarkable with regular rate and rhythm without murmur rub or thrill. Abdomen is benign with no organomegaly or masses noted. Motor sensory and DTR levels are equal and symmetric in the upper and lower extremities. Cranial nerves II through XII are grossly intact. Proprioception is intact. No peripheral adenopathy or edema is identified. No motor or sensory levels are  noted. Crude visual fields are within normal range.  RADIOLOGY RESULTS: Mammograms reviewed compatible with above-stated findings  PLAN: Present time patient is doing well now out 13 months with no evidence of disease.  Of asked to see her back in 1 year for follow-up.  She continues on tamoxifen without side effect.  Patient knows to call with any concerns.  I would like to take this opportunity to thank you for allowing me to participate in the care of your patient.Carmina Miller, MD

## 2023-04-02 ENCOUNTER — Inpatient Hospital Stay: Payer: Medicare PPO | Attending: Oncology | Admitting: Oncology

## 2023-04-02 DIAGNOSIS — D0512 Intraductal carcinoma in situ of left breast: Secondary | ICD-10-CM | POA: Diagnosis not present

## 2023-04-02 NOTE — Progress Notes (Unsigned)
 [facility]  Telephone:(336) [phone] Fax:(336) [phone]  ID: Chloe Jones OB: 1955/12/12  MR#: [id number]  HYW#:[id number]  Patient Care Team: Center, [facility] as PCP - Robbie Louis, Armando Reichert, RN as Oncology Nurse Navigator Orlie Dakin, Tollie Pizza, MD as Consulting Physician (Oncology) Carmina Miller, MD as Consulting Physician (Radiation Oncology) Carolan Shiver, MD as Consulting Physician (General Surgery)  I connected with Chloe Jones on 04/03/23 at  3:30 PM EDT by video enabled telemedicine visit and verified that I am speaking with the correct person using two identifiers.   I discussed the limitations, risks, security and privacy concerns of performing an evaluation and management service by telemedicine and the availability of in-person appointments. I also discussed with the patient that there may be a patient responsible charge related to this service. The patient expressed understanding and agreed to proceed.   Other persons participating in the visit and their role in the encounter: Patient, MD.  Patient's location: Home. Provider's location: Clinic.  CHIEF COMPLAINT: DCIS left breast  INTERVAL HISTORY: Patient agreed to video-assisted telemedicine visit for routine 18-month evaluation.  She continues to have occasional hot flashes, but otherwise is tolerating tamoxifen well without significant side effects.  She currently feels well and is asymptomatic.  She has no neurologic complaints.  She denies any recent fevers or illnesses.  She has a good appetite and denies weight loss.  She has no chest pain, shortness of breath, cough, or chills.  She denies any nausea, vomiting, constipation, or diarrhea.  She has no urinary complaints.  Patient offers no further specific complaints today.  REVIEW OF SYSTEMS:   Review of Systems  Constitutional: Negative.  Negative for fever, malaise/fatigue and weight loss.  Respiratory: Negative.   Negative for cough, hemoptysis and shortness of breath.   Cardiovascular: Negative.  Negative for chest pain and leg swelling.  Gastrointestinal: Negative.  Negative for abdominal pain.  Genitourinary: Negative.  Negative for dysuria.  Musculoskeletal: Negative.  Negative for back pain.  Skin: Negative.  Negative for rash.  Neurological:  Positive for sensory change. Negative for dizziness, focal weakness, weakness and headaches.  Psychiatric/Behavioral: Negative.  The patient is not nervous/anxious.     As per HPI. Otherwise, a complete review of systems is negative.  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   Hypertension     PAST SURGICAL HISTORY: Past Surgical History:  Procedure Laterality Date   BREAST BIOPSY Left 10/19/2021   Left Breast stereo bx, X clip- DUCTAL CARCINOMA IN SITU (DCIS), HIGH-GRADE, COMEDO TYPE WITH ASSOCIATED CALCIFICATIONS of the LEFT breast, inner aspect (x clip)   COLONOSCOPY      FAMILY HISTORY: Family History  Problem Relation Age of Onset   Breast cancer Neg Hx     ADVANCED DIRECTIVES (Y/N):  N  HEALTH MAINTENANCE: Social History   Tobacco Use   Smoking status: Former    Types: Cigarettes   Smokeless tobacco: Never  Vaping Use   Vaping status: Never Used  Substance Use Topics   Alcohol use: Yes    Comment: social   Drug use: Not Currently     Colonoscopy:  PAP:  Bone density:  Lipid panel:  No Known Allergies  Current Outpatient Medications  Medication Sig Dispense Refill   amLODipine (NORVASC) 10 MG tablet Take 10 mg by mouth daily.     losartan (COZAAR) 25 MG tablet Take 25 mg by mouth  daily.     Multiple Vitamin (MULTIVITAMIN) tablet Take 2 tablets by mouth daily. Beet Gummies     Multiple Vitamins-Minerals (DAILY COMBO MULTI VITAMINS PO) Take 2 capsules by mouth 1 day or 1 dose.     tamoxifen (NOLVADEX) 20 MG tablet TAKE 1 TABLET BY MOUTH  EVERY DAY 90 tablet 3   No current facility-administered medications for this visit.    OBJECTIVE: There were no vitals filed for this visit.    There is no height or weight on file to calculate BMI.    ECOG FS:0 - Asymptomatic  General: Well-developed, well-nourished, no acute distress. HEENT: Normocephalic. Neuro: Alert, answering all questions appropriately. Cranial nerves grossly intact. Psych: Normal affect.  LAB RESULTS:  No results found for: "NA", "K", "CL", "CO2", "GLUCOSE", "BUN", "CREATININE", "CALCIUM", "PROT", "ALBUMIN", "AST", "ALT", "ALKPHOS", "BILITOT", "GFRNONAA", "GFRAA"  Lab Results  Component Value Date   WBC 7.3 12/13/2021   HGB 12.2 12/13/2021   HCT 38.0 12/13/2021   MCV 86.2 12/13/2021   PLT 370 12/13/2021     STUDIES: No results found.  ASSESSMENT: DCIS left breast.  PLAN:    DCIS left breast: Pathology and imaging reviewed independently.  Lumpectomy on November 02, 2020 did not reveal any residual DCIS.  Given the noninvasive nature of the lesion, she did not require adjuvant chemotherapy.  Patient completed adjuvant XRT.  Continue tamoxifen for a total of 5 years completing treatment in December 2028.  Her most recent mammogram on November 15, 2022 was reported as BI-RADS 2.  Repeat in October 2025.  Patient have video-assisted telemedicine visit 2 to 3 days after her next mammogram.   Bone health: Bone mineral density on March 16, 2022 revealed a T-score of -0.3 which is normal.  Further bone densities per primary care.  I provided 20 minutes of face-to-face video visit time during this encounter which included chart review, counseling, and coordination of care as documented above.     Patient expressed understanding and was in agreement with this plan. She also understands that She can call clinic at any time with any questions, concerns, or complaints.    Cancer Staging  Ductal carcinoma in situ (DCIS) of left breast Staging form: Breast,  AJCC 8th Edition - Clinical stage from 10/28/2021: Stage 0 (cTis (DCIS), cN0, cM0, ER+, PR: Not Assessed, HER2: Not Assessed) - Signed by Jeralyn Ruths, MD on 10/28/2021 Stage prefix: Initial diagnosis   Jeralyn Ruths, MD   04/02/2023 3:36 PM

## 2023-11-16 ENCOUNTER — Ambulatory Visit
Admission: RE | Admit: 2023-11-16 | Discharge: 2023-11-16 | Disposition: A | Discharge status: Home / Self Care | Source: Ambulatory Visit | Attending: Oncology | Admitting: Oncology

## 2023-11-16 DIAGNOSIS — D0512 Intraductal carcinoma in situ of left breast: Secondary | ICD-10-CM | POA: Diagnosis present

## 2023-11-19 ENCOUNTER — Telehealth: Admitting: Oncology

## 2023-11-19 ENCOUNTER — Inpatient Hospital Stay: Admitting: Oncology

## 2023-11-27 ENCOUNTER — Inpatient Hospital Stay: Attending: Oncology | Admitting: Oncology

## 2023-11-27 ENCOUNTER — Encounter: Payer: Self-pay | Admitting: Oncology

## 2023-11-27 VITALS — BP 125/68 | HR 75 | Temp 99.9°F | Resp 18 | Ht 64.0 in | Wt 250.0 lb

## 2023-11-27 DIAGNOSIS — D0512 Intraductal carcinoma in situ of left breast: Secondary | ICD-10-CM | POA: Insufficient documentation

## 2023-11-27 DIAGNOSIS — Z923 Personal history of irradiation: Secondary | ICD-10-CM | POA: Diagnosis not present

## 2023-11-27 DIAGNOSIS — Z87891 Personal history of nicotine dependence: Secondary | ICD-10-CM | POA: Diagnosis not present

## 2023-11-27 DIAGNOSIS — Z79810 Long term (current) use of selective estrogen receptor modulators (SERMs): Secondary | ICD-10-CM | POA: Insufficient documentation

## 2023-11-27 NOTE — Progress Notes (Unsigned)
  [facility]  Telephone:(336) [phone] Fax:(336) [phone]  ID: Chloe Jones OB: April 28, 1955  MR#: [id number]  RDW#:[id number]  Patient Care Team: Center, [facility] as PCP - Diedre Ada, Shasta POUR, RN as Oncology Nurse Navigator Jacobo, Evalene PARAS, MD as Consulting Physician (Oncology) Lenn Aran, MD as Consulting Physician (Radiation Oncology) Rodolph Romano, MD as Consulting Physician (General Surgery)   CHIEF COMPLAINT: DCIS left breast  INTERVAL HISTORY: Patient returns to clinic today for routine 55-month evaluation.  She currently feels well and is asymptomatic.  She has occasional hot flashes, but otherwise is tolerating tamoxifen  well without significant side effects.  She has no neurologic complaints.  She denies any recent fevers or illnesses.  She has a good appetite and denies weight loss.  She has no chest pain, shortness of breath, cough, or chills.  She denies any nausea, vomiting, constipation, or diarrhea.  She has no urinary complaints.  Patient offers no further specific complaints today.  REVIEW OF SYSTEMS:   Review of Systems  Constitutional: Negative.  Negative for fever, malaise/fatigue and weight loss.  Respiratory: Negative.  Negative for cough, hemoptysis and shortness of breath.   Cardiovascular: Negative.  Negative for chest pain and leg swelling.  Gastrointestinal: Negative.  Negative for abdominal pain.  Genitourinary: Negative.  Negative for dysuria.  Musculoskeletal: Negative.  Negative for back pain.  Skin: Negative.  Negative for rash.  Neurological:  Positive for sensory change. Negative for dizziness, focal weakness, weakness and headaches.  Psychiatric/Behavioral: Negative.  The patient is not nervous/anxious.     As per HPI. Otherwise, a complete review of systems is negative.  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    Hypertension     PAST SURGICAL HISTORY: Past Surgical History:  Procedure Laterality Date   BREAST BIOPSY Left 10/19/2021   Left Breast stereo bx, X clip- DUCTAL CARCINOMA IN SITU (DCIS), HIGH-GRADE, COMEDO TYPE WITH ASSOCIATED CALCIFICATIONS of the LEFT breast, inner aspect (x clip)   COLONOSCOPY      FAMILY HISTORY: Family History  Problem Relation Age of Onset   Breast cancer Neg Hx     ADVANCED DIRECTIVES (Y/N):  N  HEALTH MAINTENANCE: Social History   Tobacco Use   Smoking status: Former    Types: Cigarettes   Smokeless tobacco: Never  Vaping Use   Vaping status: Never Used  Substance Use Topics   Alcohol use: Yes    Comment: social   Drug use: Not Currently     Colonoscopy:  PAP:  Bone density:  Lipid panel:  No Known Allergies  Current Outpatient Medications  Medication Sig Dispense Refill   amLODipine (NORVASC) 10 MG tablet Take 10 mg by mouth daily.     losartan (COZAAR) 25 MG tablet Take 25 mg by mouth daily.     Multiple Vitamin (MULTIVITAMIN) tablet Take 2 tablets by mouth daily. Beet Gummies     tamoxifen  (NOLVADEX ) 20 MG tablet TAKE 1 TABLET BY MOUTH EVERY DAY 90 tablet 3   No current facility-administered medications for this visit.    OBJECTIVE: Vitals:   11/27/23 1453  BP: 125/68  Pulse: 75  Resp: 18  Temp: 99.9 F (37.7 C)  SpO2: 100%      Body mass index is 42.91 kg/m.    ECOG FS:0 - Asymptomatic  General: Well-developed, well-nourished, no acute distress. Eyes: Pink conjunctiva, anicteric sclera. HEENT: Normocephalic, moist mucous membranes. Lungs: No  audible wheezing or coughing. Heart: Regular rate and rhythm. Abdomen: Soft, nontender, no obvious distention. Musculoskeletal: No edema, cyanosis, or clubbing. Neuro: Alert, answering all questions appropriately. Cranial nerves grossly intact. Skin: No rashes or petechiae noted. Psych: Normal affect.   LAB RESULTS:  No results found for: NA, K, CL, CO2, GLUCOSE,  BUN, CREATININE, CALCIUM, PROT, ALBUMIN, AST, ALT, ALKPHOS, BILITOT, GFRNONAA, GFRAA  Lab Results  Component Value Date   WBC 7.3 12/13/2021   HGB 12.2 12/13/2021   HCT 38.0 12/13/2021   MCV 86.2 12/13/2021   PLT 370 12/13/2021     STUDIES: MM 3D DIAGNOSTIC MAMMOGRAM BILATERAL BREAST Result Date: 11/16/2023 CLINICAL DATA:  Annual post lumpectomy surveillance. Patient underwent a left lumpectomy for breast carcinoma in October 2023 treated with adjuvant radiation therapy. Patient has some intermittent left breast tingling and discomfort, but no other complaints. EXAM: DIGITAL DIAGNOSTIC BILATERAL MAMMOGRAM WITH TOMOSYNTHESIS AND CAD TECHNIQUE: Bilateral digital diagnostic mammography and breast tomosynthesis was performed. The images were evaluated with computer-aided detection. COMPARISON:  Previous exam(s). ACR Breast Density Category c: The breasts are heterogeneously dense, which may obscure small masses. FINDINGS: Post lumpectomy changes in the lower inner left breast are stable. There are no breast masses, areas of significant asymmetry, areas of nonsurgical architectural distortion or suspicious calcifications. IMPRESSION: 1. No evidence of new or recurrent breast malignancy. 2. Benign post lumpectomy changes on the left. RECOMMENDATION: Screening mammogram in one year.(Code:SM-B-01Y) I have discussed the findings and recommendations with the patient. If applicable, a reminder letter will be sent to the patient regarding the next appointment. BI-RADS CATEGORY  2: Benign. Electronically Signed   By: Alm Parkins M.D.   On: 11/16/2023 15:03    ASSESSMENT: DCIS left breast.  PLAN:    DCIS left breast: Pathology and imaging reviewed independently.  Patient underwent lumpectomy on November 02, 2020 which did not reveal any residual DCIS.  Given the noninvasive nature of the lesion, she did not require adjuvant chemotherapy.  Patient completed adjuvant XRT in December  2023.  Continue tamoxifen  for a total of 5 years completing treatment in December 2028.  Her most recent mammogram on November 16, 2023 was reported as BI-RADS 2.  Repeat in October 2026.  Return to clinic in 6 months for routine evaluation.   Bone health: Bone mineral density on March 16, 2022 revealed a T-score of -0.3 which is normal.  Further bone densities per primary care.  I spent a total of 20 minutes reviewing chart data, face-to-face evaluation with the patient, counseling and coordination of care as detailed above.   Patient expressed understanding and was in agreement with this plan. She also understands that She can call clinic at any time with any questions, concerns, or complaints.    Cancer Staging  Ductal carcinoma in situ (DCIS) of left breast Staging form: Breast, AJCC 8th Edition - Clinical stage from 10/28/2021: Stage 0 (cTis (DCIS), cN0, cM0, ER+, PR: Not Assessed, HER2: Not Assessed) - Signed by Jacobo Evalene PARAS, MD on 10/28/2021 Stage prefix: Initial diagnosis   Evalene PARAS Jacobo, MD   11/27/2023 3:03 PM

## 2023-11-27 NOTE — Progress Notes (Unsigned)
Patient is doing ok.

## 2024-01-28 NOTE — Progress Notes (Signed)
 [facility] - Patient Enrolled  This serves as confirmation that your patient, Chloe Jones, has enrolled in [place].  Drucella Blocker was identified by provider referral as a candidate for [facility]) service(s).  An appointment has been scheduled for [facility] on 01/30/2023 with a [facility] Coordinator, Chloe Jones. Chloe  Jones    [address]; [place], [place] [address] l  [email or web address] l [phone]   For more information on [facility] services, click here.

## 2024-02-14 ENCOUNTER — Encounter: Payer: Self-pay | Admitting: Radiation Oncology

## 2024-02-14 ENCOUNTER — Ambulatory Visit
Admission: RE | Admit: 2024-02-14 | Discharge: 2024-02-14 | Disposition: A | Discharge status: Home / Self Care | Payer: Medicare PPO | Source: Ambulatory Visit | Attending: Radiation Oncology | Admitting: Radiation Oncology

## 2024-02-14 VITALS — BP 138/89 | HR 89 | Temp 98.2°F | Resp 20 | Wt 254.0 lb

## 2024-02-14 DIAGNOSIS — Z923 Personal history of irradiation: Secondary | ICD-10-CM | POA: Diagnosis not present

## 2024-02-14 DIAGNOSIS — Z170 Estrogen receptor positive status [ER+]: Secondary | ICD-10-CM | POA: Diagnosis not present

## 2024-02-14 DIAGNOSIS — Z79810 Long term (current) use of selective estrogen receptor modulators (SERMs): Secondary | ICD-10-CM | POA: Insufficient documentation

## 2024-02-14 DIAGNOSIS — D0512 Intraductal carcinoma in situ of left breast: Secondary | ICD-10-CM | POA: Insufficient documentation

## 2024-02-14 NOTE — Progress Notes (Signed)
 Radiation Oncology Follow up Note  Name: Chloe Jones   Date:   02/14/2024  MRN:  [id number] DOB: 07-15-1955    This 69 y.o. female presents to the clinic today for 2-year follow-up status post whole breast radiation to her left breast for ER-positive ductal carcinoma in situ  REFERRING PROVIDER: Center, [facility]  HPI: Patient is a 69 year old female now at 2 years having completed whole breast radiation to her left breast for ER-positive ductal carcinoma in situ.  She is seen today in routine follow-up and is doing well specifically denies breast tenderness cough or bone pain..  She had mammograms back in October which I have reviewed.  They were BI-RADS 2 category 2 benign.  She is currently on tamoxifen  tolerating it well without side effect.  COMPLICATIONS OF TREATMENT: none  FOLLOW UP COMPLIANCE: keeps appointments   PHYSICAL EXAM:  BP 138/89   Pulse 89   Temp 98.2 F (36.8 C) (Tympanic)   Resp 20   Wt 254 lb (115.2 kg)   BMI 43.60 kg/m  Lungs are clear to A&P cardiac examination essentially unremarkable with regular rate and rhythm. No dominant mass or nodularity is noted in either breast in 2 positions examined. Incision is well-healed. No axillary or supraclavicular adenopathy is appreciated. Cosmetic result is excellent.  Well-developed well-nourished patient in NAD. HEENT reveals PERLA, EOMI, discs not visualized.  Oral cavity is clear. No oral mucosal lesions are identified. Neck is clear without evidence of cervical or supraclavicular adenopathy. Lungs are clear to A&P. Cardiac examination is essentially unremarkable with regular rate and rhythm without murmur rub or thrill. Abdomen is benign with no organomegaly or masses noted. Motor sensory and DTR levels are equal and symmetric in the upper and lower extremities. Cranial nerves II through XII are grossly intact. Proprioception is intact. No peripheral adenopathy or edema is identified. No motor or sensory  levels are noted. Crude visual fields are within normal range.  RADIOLOGY RESULTS: Grams reviewed compatible with above-stated findings  PLAN: Present time patient is now out over 2 years from whole breast radiation for ER-positive ductal carcinoma in situ.  I am going to turn follow-up care over to her other providers.  I would be happy to reevaluate her at any time should that be indicated.  She continues follow-up care with Dr. Jacobo.  Patient is to call with any concerns.  I would like to take this opportunity to thank you for allowing me to participate in the care of your patient.Chloe Marcey Penton, MD

## 2024-05-26 ENCOUNTER — Inpatient Hospital Stay: Admitting: Oncology
